# Patient Record
Sex: Female | Born: 1945 | Race: Asian | Hispanic: No | Marital: Married | Smoking: Never smoker
Health system: Southern US, Community
[De-identification: ages and names within clinical notes are randomized; demographics above are authoritative.]

## PROBLEM LIST (undated history)

## (undated) DIAGNOSIS — I10 Essential (primary) hypertension: Secondary | ICD-10-CM

## (undated) DIAGNOSIS — D126 Benign neoplasm of colon, unspecified: Secondary | ICD-10-CM

## (undated) DIAGNOSIS — K5790 Diverticulosis of intestine, part unspecified, without perforation or abscess without bleeding: Secondary | ICD-10-CM

## (undated) DIAGNOSIS — F32A Depression, unspecified: Secondary | ICD-10-CM

## (undated) DIAGNOSIS — R011 Cardiac murmur, unspecified: Secondary | ICD-10-CM

## (undated) DIAGNOSIS — C50919 Malignant neoplasm of unspecified site of unspecified female breast: Secondary | ICD-10-CM

## (undated) DIAGNOSIS — F329 Major depressive disorder, single episode, unspecified: Secondary | ICD-10-CM

## (undated) DIAGNOSIS — I499 Cardiac arrhythmia, unspecified: Secondary | ICD-10-CM

## (undated) DIAGNOSIS — Z9289 Personal history of other medical treatment: Secondary | ICD-10-CM

## (undated) HISTORY — DX: Diverticulosis of intestine, part unspecified, without perforation or abscess without bleeding: K57.90

## (undated) HISTORY — PX: TUBAL LIGATION: SHX77

## (undated) HISTORY — PX: CATARACT EXTRACTION, BILATERAL: SHX1313

## (undated) HISTORY — DX: Benign neoplasm of colon, unspecified: D12.6

## (undated) HISTORY — PX: BREAST SURGERY: SHX581

## (undated) HISTORY — DX: Malignant neoplasm of unspecified site of unspecified female breast: C50.919

---

## 2015-07-05 ENCOUNTER — Emergency Department (HOSPITAL_COMMUNITY): Payer: Medicare Other

## 2015-07-05 ENCOUNTER — Encounter (HOSPITAL_COMMUNITY): Payer: Self-pay | Admitting: Emergency Medicine

## 2015-07-05 ENCOUNTER — Inpatient Hospital Stay (HOSPITAL_COMMUNITY)
Admission: EM | Admit: 2015-07-05 | Discharge: 2015-07-08 | DRG: 481 | Disposition: A | Discharge status: Skilled Nursing Facility | Payer: Medicare Other | Attending: Internal Medicine | Admitting: Internal Medicine

## 2015-07-05 DIAGNOSIS — Z79899 Other long term (current) drug therapy: Secondary | ICD-10-CM

## 2015-07-05 DIAGNOSIS — I10 Essential (primary) hypertension: Secondary | ICD-10-CM | POA: Diagnosis present

## 2015-07-05 DIAGNOSIS — C50919 Malignant neoplasm of unspecified site of unspecified female breast: Secondary | ICD-10-CM | POA: Diagnosis present

## 2015-07-05 DIAGNOSIS — N390 Urinary tract infection, site not specified: Secondary | ICD-10-CM | POA: Diagnosis present

## 2015-07-05 DIAGNOSIS — D62 Acute posthemorrhagic anemia: Secondary | ICD-10-CM | POA: Diagnosis not present

## 2015-07-05 DIAGNOSIS — M25552 Pain in left hip: Secondary | ICD-10-CM | POA: Diagnosis present

## 2015-07-05 DIAGNOSIS — I472 Ventricular tachycardia: Secondary | ICD-10-CM | POA: Diagnosis present

## 2015-07-05 DIAGNOSIS — I4729 Other ventricular tachycardia: Secondary | ICD-10-CM

## 2015-07-05 DIAGNOSIS — Z853 Personal history of malignant neoplasm of breast: Secondary | ICD-10-CM

## 2015-07-05 DIAGNOSIS — Y92512 Supermarket, store or market as the place of occurrence of the external cause: Secondary | ICD-10-CM

## 2015-07-05 DIAGNOSIS — R079 Chest pain, unspecified: Secondary | ICD-10-CM | POA: Diagnosis not present

## 2015-07-05 DIAGNOSIS — R008 Other abnormalities of heart beat: Secondary | ICD-10-CM | POA: Diagnosis present

## 2015-07-05 DIAGNOSIS — C7951 Secondary malignant neoplasm of bone: Secondary | ICD-10-CM | POA: Diagnosis present

## 2015-07-05 DIAGNOSIS — W010XXA Fall on same level from slipping, tripping and stumbling without subsequent striking against object, initial encounter: Secondary | ICD-10-CM | POA: Diagnosis present

## 2015-07-05 DIAGNOSIS — I493 Ventricular premature depolarization: Secondary | ICD-10-CM | POA: Diagnosis present

## 2015-07-05 DIAGNOSIS — I499 Cardiac arrhythmia, unspecified: Secondary | ICD-10-CM

## 2015-07-05 DIAGNOSIS — Z09 Encounter for follow-up examination after completed treatment for conditions other than malignant neoplasm: Secondary | ICD-10-CM

## 2015-07-05 DIAGNOSIS — I498 Other specified cardiac arrhythmias: Secondary | ICD-10-CM | POA: Diagnosis present

## 2015-07-05 DIAGNOSIS — S7222XA Displaced subtrochanteric fracture of left femur, initial encounter for closed fracture: Principal | ICD-10-CM | POA: Diagnosis present

## 2015-07-05 DIAGNOSIS — C4020 Malignant neoplasm of long bones of unspecified lower limb: Secondary | ICD-10-CM

## 2015-07-05 DIAGNOSIS — S7292XA Unspecified fracture of left femur, initial encounter for closed fracture: Secondary | ICD-10-CM | POA: Diagnosis present

## 2015-07-05 HISTORY — DX: Essential (primary) hypertension: I10

## 2015-07-05 LAB — BASIC METABOLIC PANEL
ANION GAP: 7 (ref 5–15)
BUN: 13 mg/dL (ref 6–20)
CALCIUM: 8.9 mg/dL (ref 8.9–10.3)
CO2: 27 mmol/L (ref 22–32)
CREATININE: 0.6 mg/dL (ref 0.44–1.00)
Chloride: 108 mmol/L (ref 101–111)
Glucose, Bld: 132 mg/dL — ABNORMAL HIGH (ref 65–99)
Potassium: 3.5 mmol/L (ref 3.5–5.1)
SODIUM: 142 mmol/L (ref 135–145)

## 2015-07-05 LAB — CBC WITH DIFFERENTIAL/PLATELET
BASOS ABS: 0 10*3/uL (ref 0.0–0.1)
BASOS PCT: 0 %
EOS ABS: 0 10*3/uL (ref 0.0–0.7)
Eosinophils Relative: 0 %
HCT: 37.4 % (ref 36.0–46.0)
HEMOGLOBIN: 12.7 g/dL (ref 12.0–15.0)
Lymphocytes Relative: 16 %
Lymphs Abs: 1.5 10*3/uL (ref 0.7–4.0)
MCH: 32.1 pg (ref 26.0–34.0)
MCHC: 34 g/dL (ref 30.0–36.0)
MCV: 94.4 fL (ref 78.0–100.0)
MONOS PCT: 4 %
Monocytes Absolute: 0.3 10*3/uL (ref 0.1–1.0)
NEUTROS PCT: 80 %
Neutro Abs: 7.3 10*3/uL (ref 1.7–7.7)
Platelets: 220 10*3/uL (ref 150–400)
RBC: 3.96 MIL/uL (ref 3.87–5.11)
RDW: 12.1 % (ref 11.5–15.5)
WBC: 9.2 10*3/uL (ref 4.0–10.5)

## 2015-07-05 LAB — MAGNESIUM: Magnesium: 1.8 mg/dL (ref 1.7–2.4)

## 2015-07-05 LAB — TROPONIN I: Troponin I: <0.03 ng/mL (ref <0.031)

## 2015-07-05 LAB — TYPE AND SCREEN
ABO/RH(D): AB POS
Antibody Screen: NEGATIVE

## 2015-07-05 LAB — PROTIME-INR
INR: 1.03 (ref 0.00–1.49)
PROTHROMBIN TIME: 13.7 s (ref 11.6–15.2)

## 2015-07-05 LAB — ABO/RH: ABO/RH(D): AB POS

## 2015-07-05 MED ORDER — KETOROLAC TROMETHAMINE 30 MG/ML IJ SOLN
30.0000 mg | Freq: Once | INTRAMUSCULAR | Status: AC
  Administered 2015-07-05: 30 mg via INTRAVENOUS
  Filled 2015-07-05: qty 1

## 2015-07-05 MED ORDER — METOPROLOL TARTRATE 25 MG PO TABS
25.0000 mg | ORAL_TABLET | Freq: Two times a day (BID) | ORAL | Status: DC
  Administered 2015-07-05 – 2015-07-08 (×6): 25 mg via ORAL
  Filled 2015-07-05 (×6): qty 1

## 2015-07-05 MED ORDER — POTASSIUM CHLORIDE 10 MEQ/100ML IV SOLN
10.0000 meq | INTRAVENOUS | Status: AC
  Administered 2015-07-05 (×2): 10 meq via INTRAVENOUS
  Filled 2015-07-05: qty 100

## 2015-07-05 MED ORDER — ONDANSETRON HCL 4 MG/2ML IJ SOLN: 4.0000 mg | Freq: Four times a day (QID) | INTRAMUSCULAR | Status: DC | PRN

## 2015-07-05 MED ORDER — METOPROLOL TARTRATE 25 MG PO TABS: 25.0000 mg | ORAL_TABLET | Freq: Once | ORAL | Status: AC

## 2015-07-05 MED ORDER — SODIUM CHLORIDE 0.9 % IJ SOLN
3.0000 mL | Freq: Two times a day (BID) | INTRAMUSCULAR | Status: DC
  Administered 2015-07-05: 3 mL via INTRAVENOUS

## 2015-07-05 MED ORDER — LISINOPRIL 20 MG PO TABS
20.0000 mg | ORAL_TABLET | Freq: Every day | ORAL | Status: DC
  Administered 2015-07-06 – 2015-07-08 (×3): 20 mg via ORAL
  Filled 2015-07-05 (×3): qty 1

## 2015-07-05 MED ORDER — MORPHINE SULFATE (PF) 4 MG/ML IV SOLN
4.0000 mg | Freq: Once | INTRAVENOUS | Status: AC
  Administered 2015-07-05: 4 mg via INTRAVENOUS
  Filled 2015-07-05: qty 1

## 2015-07-05 MED ORDER — MAGNESIUM SULFATE 2 GM/50ML IV SOLN
2.0000 g | Freq: Once | INTRAVENOUS | Status: AC
  Administered 2015-07-05: 2 g via INTRAVENOUS
  Filled 2015-07-05: qty 50

## 2015-07-05 MED ORDER — POTASSIUM CHLORIDE IN NACL 20-0.9 MEQ/L-% IV SOLN
INTRAVENOUS | Status: AC
Start: 2015-07-05 — End: 2015-07-07
  Administered 2015-07-05 – 2015-07-06 (×2): via INTRAVENOUS
  Filled 2015-07-05 (×5): qty 1000

## 2015-07-05 MED ORDER — BISMUTH SUBSALICYLATE 262 MG/15ML PO SUSP: 30.0000 mL | Freq: Four times a day (QID) | ORAL | Status: DC | PRN

## 2015-07-05 MED ORDER — PANTOPRAZOLE SODIUM 40 MG PO TBEC
40.0000 mg | DELAYED_RELEASE_TABLET | Freq: Every day | ORAL | Status: DC
  Administered 2015-07-05 – 2015-07-08 (×4): 40 mg via ORAL
  Filled 2015-07-05 (×4): qty 1

## 2015-07-05 MED ORDER — CALCIUM CARBONATE-VITAMIN D 500-200 MG-UNIT PO TABS
ORAL_TABLET | Freq: Two times a day (BID) | ORAL | Status: DC
  Administered 2015-07-05 – 2015-07-08 (×6): 1 via ORAL
  Filled 2015-07-05 (×8): qty 1

## 2015-07-05 MED ORDER — POTASSIUM CHLORIDE 10 MEQ/100ML IV SOLN
10.0000 meq | INTRAVENOUS | Status: AC
  Filled 2015-07-05: qty 100

## 2015-07-05 MED ORDER — MORPHINE SULFATE (PF) 4 MG/ML IV SOLN
4.0000 mg | INTRAVENOUS | Status: DC | PRN
  Administered 2015-07-06: 4 mg via INTRAVENOUS
  Filled 2015-07-05: qty 1

## 2015-07-05 MED ORDER — RISAQUAD PO CAPS
1.0000 | ORAL_CAPSULE | Freq: Every day | ORAL | Status: DC
  Administered 2015-07-06 – 2015-07-08 (×3): 1 via ORAL
  Filled 2015-07-05 (×4): qty 1

## 2015-07-05 MED ORDER — METOPROLOL TARTRATE 1 MG/ML IV SOLN
5.0000 mg | Freq: Once | INTRAVENOUS | Status: AC
  Administered 2015-07-05: 5 mg via INTRAVENOUS
  Filled 2015-07-05: qty 5

## 2015-07-05 MED ORDER — EXEMESTANE 25 MG PO TABS
25.0000 mg | ORAL_TABLET | Freq: Every day | ORAL | Status: DC
  Administered 2015-07-06 – 2015-07-08 (×3): 25 mg via ORAL
  Filled 2015-07-05 (×3): qty 1

## 2015-07-05 NOTE — ED Notes (Signed)
2 IV start attempts 

## 2015-07-05 NOTE — H&P (Signed)
[facility] History and Physical  [name] [id number] DOB: 30-Jun-1946 DOA: 07/05/2015  Referring physician: Noemi Chapel, M.D. PCP: No primary care provider on file.   Chief Complaint: Fall  HPI: [name] is a 69 y.o. female with a past medical history of breast cancer, hypertension, unspecified arrhythmia (which the patient stated was due to anxiety) who was brought to the emergency department via EMS after she fell at a grocery store while she was walking on a slippery floor. Per patient, she fell on her left side, had immediate intense pain, noticed a deformity in her thigh and was unable to get up on her own. She states that she did not lose consciousness or have head trauma during the fall. She denies having pre-fall chest pain, palpitations, dizziness, dyspnea, diaphoresis, nausea or emesis. She states that this was purely an accident.  During transportation to the emergency department, the patient was given intranasal fentanyl which helped relieve her pain. EMS described 7 beat nonsustained ventricular tachycardia run, with other smaller runs noticed on telemetry in the emergency department alternating with ventricular bigeminy. The patient is taking metoprolol 25 mg by mouth twice a day, but had not taken the last 2 scheduled doses prior to her accident. She has a previous history of tachyarrhythmia, but workup done several years ago apparently did not show any CAD.   When seen in the emergency department, the patient was in no acute distress. She is states that she was in no pain, as long as she did not move her left lower extremity.   Review of Systems:  Constitutional:  No weight loss, night sweats, Fevers, chills, fatigue.  HEENT:  No headaches, Difficulty swallowing,Tooth/dental problems,Sore throat,  No sneezing, itching, ear ache, nasal congestion, post nasal drip,  Cardio-vascular:  No chest pain, Orthopnea, PND, swelling in lower extremities, anasarca,  dizziness, palpitations  GI:  No heartburn, indigestion, abdominal pain, nausea, vomiting, diarrhea, change in bowel habits, loss of appetite  Resp:  Denies dyspnea, productive cough, hemoptysis or wheezing. Skin:  no rash or lesions.  GU:  no dysuria, change in color of urine, no urgency or frequency. No flank pain.  Musculoskeletal:  Left femur/thigh pain and decreased range of motion. Chronic back pain.  Psych:  No change in mood or affect. No depression or anxiety. No memory loss.   Past Medical History  Diagnosis Date  . Hypertension   . Cancer [facility])    Past Surgical History  Procedure Laterality Date  . Breast surgery    . Tubal ligation     Social History:  reports that she has never smoked. She does not have any smokeless tobacco history on file. She reports that she drinks alcohol. She reports that she does not use illicit drugs.  Allergies  Allergen Reactions  . Keflex [Cephalexin] Diarrhea    No family history on file.   Prior to Admission medications   Medication Sig Start Date End Date Taking? Authorizing Provider  bismuth subsalicylate (PEPTO BISMOL) 262 MG/15ML suspension Take 30 mLs by mouth every 6 (six) hours as needed for indigestion.   Yes Historical Provider, MD  Calcium Carb-Cholecalciferol (CALCIUM 600 + D PO) Take 1 tablet by mouth 2 (two) times daily.   Yes Historical Provider, MD  exemestane (AROMASIN) 25 MG tablet Take 25 mg by mouth daily. 07/04/15  Yes Historical Provider, MD  lisinopril (PRINIVIL,ZESTRIL) 20 MG tablet Take 20 mg by mouth daily. 06/01/15  Yes Historical Provider, MD  metoprolol tartrate (  LOPRESSOR) 25 MG tablet Take 25 mg by mouth 2 (two) times daily.   Yes Historical Provider, MD  naproxen sodium (ANAPROX) 220 MG tablet Take 440 mg by mouth daily as needed (pain).   Yes Historical Provider, MD  Probiotic Product (PROBIOTIC PO) Take 1 capsule by mouth daily.   Yes Historical Provider, MD   Physical Exam: Filed Vitals:    07/05/15 1629 07/05/15 1855  BP: 150/67 146/78  Pulse: 54 58  Temp: 97.9 F (36.6 C) 97.9 F (36.6 C)  TempSrc: Oral Oral  Resp: 16 16  SpO2: 97% 98%    Wt Readings from Last 3 Encounters:  No data found for Wt    General:  Appears calm and comfortable Eyes: PERRL, normal lids, irises & conjunctiva ENT: grossly normal hearing, lips & oral mucosa are moist. Neck: no LAD, masses or thyromegaly Cardiovascular: Irregular rhythm, tachycardic. No LE edema. Telemetry: Sinus tachycardia with ventricular bigeminy and occasional nonsustained ventricular tachycardia (longest 7 beats). Respiratory: CTA bilaterally, no w/r/r. Normal respiratory effort. Abdomen: soft, ntnd Skin: no rash or induration seen on limited exam Musculoskeletal: Shortened left lower extremity, palpable distal pulses. No delayed capillary refill. Psychiatric: grossly normal mood and affect, speech fluent and appropriate Neurologic: grossly non-focal.          Labs on Admission:  Basic Metabolic Panel:  Recent Labs Lab 07/05/15 1732 07/05/15 1832  NA 142  --   K 3.5  --   CL 108  --   CO2 27  --   GLUCOSE 132*  --   BUN 13  --   CREATININE 0.60  --   CALCIUM 8.9  --   MG  --  1.8   CBC:  Recent Labs Lab 07/05/15 1732  WBC 9.2  NEUTROABS 7.3  HGB 12.7  HCT 37.4  MCV 94.4  PLT 220   Cardiac Enzymes:  Recent Labs Lab 07/05/15 1732  TROPONINI <0.03    Radiological Exams on Admission: Dg Chest 1 View  07/05/2015  CLINICAL DATA:  Preop for hip fracture. EXAM: CHEST 1 VIEW COMPARISON:  None. FINDINGS: Cardiomegaly with negative aortic and hilar contours. There is no edema, consolidation, effusion, or pneumothorax. Left axillary dissection. No acute osseous findings at the chest. IMPRESSION: 1. No active disease. 2. Mild cardiomegaly. Electronically Signed   By: Monte Fantasia M.D.   On: 07/05/2015 17:20   Dg Hip Unilat With Pelvis 2-3 Views Left  07/05/2015  CLINICAL DATA:  Status post  fall. EXAM: DG HIP (WITH OR WITHOUT PELVIS) 2-3V LEFT COMPARISON:  None. FINDINGS: There is no left hip fracture or dislocation. There is a displaced and angulated fracture of the left proximal femoral diaphysis with 15 mm of lateral and 3 cm of posterior displacement and apex lateral angulation. There are sclerotic lesions scattered throughout the pelvis and proximal bilateral femurs most concerning for metastatic disease. IMPRESSION: 1. Displaced and angulated fracture of the left proximal femoral diaphysis . 2. 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 Electronically Signed   By: Kathreen Devoid   On: 07/05/2015 17:24   Dg Femur Min 2 Views Left  07/05/2015  CLINICAL DATA:  69 year old female with history of trauma from a fall complaining of left upper leg pain. EXAM: LEFT FEMUR 2 VIEWS COMPARISON:  No priors. FINDINGS: Two nonstandard views of the left femur are submitted for evaluation. There is a displaced foreshortened fracture in the proximal third of the left femoral diaphysis, with  approximately 1 shaft width of posterior displacement and at least 3.9 cm of proximal migration of the distal femoral fragment, and approximately 30 degrees of apex anterolateral angulation. Femoral head appears located. IMPRESSION: 1. Acute displaced foreshortened and angulated fracture of the proximal third of the left femoral diaphysis, as above. Electronically Signed   By: Vinnie Langton M.D.   On: 07/05/2015 17:22    EKG: Independently reviewed. Vent. rate 117 BPM PR interval 144 ms QRS duration 128 ms QT/QTc 391/546 ms P-R-T axes 65 70 64  Sinus tachycardia Ventricular bigeminy Probable left atrial enlargement Right bundle branch block Borderline ST elevation Baseline wander in lead(s) V6  Assessment/Plan Principal Problem:   Femur fracture, left, closed, initial encounter Admit to telemetry. Continue analgesics as needed. Orthopedic  surgery to evaluate. Check preop Echocardiogram.  Active Problems:    Ventricular bigeminy   Non-sustained ventricular tachycardia (HCC) The patient is asymptomatic at this time. She missed the last 2 doses of metoprolol. Single dose of IV metoprolol 5 mg 1 Resume oral metoprolol this evening. Optimize potassium and magnesium levels.    Breast cancer ([place]) Continue on Aromasin 25 mg by mouth daily.    Hypertension Continue metoprolol 25 mg by mouth twice a day. Monitor blood pressure closely while the patient is in the hospital.   Orthopedic surgery is following   Code Status: Full code. DVT Prophylaxis: SCDs on right lower extremity prior to surgery. Family Communication:  Disposition Plan: Admit to telemetry, check preop echocardiogram. Orthopedic surgery to evaluate.  Time spent: Over 70 minutes were spent during the process of this admission.  Reubin Milan [facility] Pager [phone].

## 2015-07-05 NOTE — ED Notes (Addendum)
Patient presents via EMS for fall. Per EMS slipped and fell, deformity to left hip, c-collar in place, denies LOC. History of breast CA. No anticoagulants. A&O x4.   226mcg fent intranasally   Last Vs 152/84, 100hr, 16resp, 97%ra

## 2015-07-05 NOTE — ED Notes (Signed)
Patient transported to X-ray 

## 2015-07-05 NOTE — ED Provider Notes (Signed)
CSN: [id number]     Arrival date & time 07/05/15  1611 History   First MD Initiated Contact with Patient 07/05/15 1613     Chief Complaint  Patient presents with  . Fall  . Hip Injury     (Consider location/radiation/quality/duration/timing/severity/associated sxs/prior Treatment) HPI Comments: The patient is a 69 year old female, she was in the grocery store walking when she slipped on a slick floor, fell onto her left hip and had acute onset of pain and deformity. Paramedics were called and found the patient on the floor unable to ambulate with a deformity of the proximal thigh. Her leg was shortened and externally rotated. She denies head injury or loss of consciousness, there is no neck pain numbness or weakness however they immobilized her neck for travel. 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 She is not on any blood thinners, she was given 200 g of fentanyl intranasally prior to arrival. Her pain is persistent, moderate, worse with movement.  Patient is a 69 y.o. female presenting with fall. The history is provided by the patient.  Fall    No past medical history on file. No past surgical history on file. No family history on file. Social History  Substance Use Topics  . Smoking status: Not on file  . Smokeless tobacco: Not on file  . Alcohol Use: Not on file   OB History    No data available     Review of Systems  All other systems reviewed and are negative.     Allergies  Review of patient's allergies indicates not on file.  Home Medications   Prior to Admission medications   Not on File   There were no vitals taken for this visit. Physical Exam  Constitutional: She appears well-developed and well-nourished. No distress.  HENT:  Head: Normocephalic and atraumatic.  Mouth/Throat: Oropharynx is clear and moist. No oropharyngeal exudate.  Eyes: Conjunctivae and  EOM are normal. Pupils are equal, round, and reactive to light. Right eye exhibits no discharge. Left eye exhibits no discharge. No scleral icterus.  Neck: Normal range of motion. Neck supple. No JVD present. No thyromegaly present.  Cardiovascular: Normal rate, regular rhythm, normal heart sounds and intact distal pulses.  Exam reveals no gallop and no friction rub.   No murmur heard. Normal pulses at the foot on the left  Pulmonary/Chest: Effort normal and breath sounds normal. No respiratory distress. She has no wheezes. She has no rales.  Abdominal: Soft. Bowel sounds are normal. She exhibits no distension and no mass. There is no tenderness.  Musculoskeletal: Normal range of motion. She exhibits tenderness ( Left lower extremity is shortened and externally rotated, there is obvious deformity of the proximal thigh with swelling). She exhibits no edema.  Lymphadenopathy:    She has no cervical adenopathy.  Neurological: She is alert. Coordination normal.  Normal sensation and motor to the distal left lower extremity from the knee down  Skin: Skin is warm and dry. No rash noted. No erythema.  Psychiatric: She has a normal mood and affect. Her behavior is normal.  Nursing note and vitals reviewed.   ED Course  Procedures (including critical care time) Labs Review Labs Reviewed  BASIC METABOLIC PANEL  CBC WITH DIFFERENTIAL/PLATELET  PROTIME-INR  URINALYSIS, ROUTINE W REFLEX MICROSCOPIC (NOT AT [facility])  TROPONIN I  TYPE AND SCREEN    Imaging Review No results found.  I have personally reviewed and evaluated these images and lab results as part of my medical decision-making.   EKG Interpretation None      MDM   Final diagnoses:  None    The patient has had a fall, there is no head injury, she has an obvious deformity of the thigh consistent with a femoral fracture, she will need pain control, preop labs, orthopedic consultation if broken. The patient is in agreement with the  plan. The paramedics have also shown me strips from cardiac monitoring in route which shows several runs of PVCs 4 or 5 beats in a row. This appears to be nonsustained ventricular tachycardia. The patient denies any chest pain shortness of breath or weakness to me. She definitely states that the floor was slippery, the paramedics confirm this report.  D/w Dr. Alvan Dame - will do surgery D/w Dr. Olevia Bowens - will admit  Meds given in ED:  Medications  morphine 4 MG/ML injection 4 mg (not administered)  metoprolol (LOPRESSOR) injection 5 mg (not administered)  magnesium sulfate IVPB 2 g 50 mL (not administered)      Noemi Chapel, MD 07/05/15 1844

## 2015-07-05 NOTE — ED Notes (Signed)
IV team bedside. 

## 2015-07-05 NOTE — ED Notes (Signed)
Bed: WA04 Expected date:  Expected time:  Means of arrival:  Comments: EMS 

## 2015-07-05 NOTE — ED Notes (Signed)
Waiting for patient to call out when ready

## 2015-07-06 ENCOUNTER — Inpatient Hospital Stay (HOSPITAL_COMMUNITY): Payer: Medicare Other

## 2015-07-06 ENCOUNTER — Inpatient Hospital Stay (HOSPITAL_COMMUNITY): Payer: Medicare Other | Admitting: Certified Registered"

## 2015-07-06 ENCOUNTER — Encounter (HOSPITAL_COMMUNITY): Payer: Self-pay | Admitting: Certified Registered"

## 2015-07-06 ENCOUNTER — Encounter (HOSPITAL_COMMUNITY)
Admission: EM | Disposition: A | Discharge status: Skilled Nursing Facility | Payer: Self-pay | Source: Home / Self Care | Attending: Internal Medicine

## 2015-07-06 DIAGNOSIS — I10 Essential (primary) hypertension: Secondary | ICD-10-CM

## 2015-07-06 DIAGNOSIS — S7292XA Unspecified fracture of left femur, initial encounter for closed fracture: Secondary | ICD-10-CM

## 2015-07-06 DIAGNOSIS — I472 Ventricular tachycardia: Secondary | ICD-10-CM

## 2015-07-06 DIAGNOSIS — R079 Chest pain, unspecified: Secondary | ICD-10-CM

## 2015-07-06 DIAGNOSIS — I499 Cardiac arrhythmia, unspecified: Secondary | ICD-10-CM

## 2015-07-06 DIAGNOSIS — C50919 Malignant neoplasm of unspecified site of unspecified female breast: Secondary | ICD-10-CM

## 2015-07-06 DIAGNOSIS — S7222XA Displaced subtrochanteric fracture of left femur, initial encounter for closed fracture: Secondary | ICD-10-CM | POA: Diagnosis present

## 2015-07-06 HISTORY — PX: FEMUR IM NAIL: SHX1597

## 2015-07-06 LAB — COMPREHENSIVE METABOLIC PANEL
ALBUMIN: 3.7 g/dL (ref 3.5–5.0)
ALT: 9 U/L — ABNORMAL LOW (ref 14–54)
ANION GAP: 8 (ref 5–15)
AST: 20 U/L (ref 15–41)
Alkaline Phosphatase: 45 U/L (ref 38–126)
BILIRUBIN TOTAL: 0.8 mg/dL (ref 0.3–1.2)
BUN: 14 mg/dL (ref 6–20)
CHLORIDE: 108 mmol/L (ref 101–111)
CO2: 22 mmol/L (ref 22–32)
Calcium: 8.3 mg/dL — ABNORMAL LOW (ref 8.9–10.3)
Creatinine, Ser: 0.64 mg/dL (ref 0.44–1.00)
GFR calc Af Amer: >60 mL/min (ref >60)
GFR calc non Af Amer: >60 mL/min (ref >60)
GLUCOSE: 103 mg/dL — AB (ref 65–99)
POTASSIUM: 3.8 mmol/L (ref 3.5–5.1)
SODIUM: 138 mmol/L (ref 135–145)
TOTAL PROTEIN: 6.5 g/dL (ref 6.5–8.1)

## 2015-07-06 LAB — URINE MICROSCOPIC-ADD ON

## 2015-07-06 LAB — URINALYSIS, ROUTINE W REFLEX MICROSCOPIC
Bilirubin Urine: NEGATIVE
GLUCOSE, UA: NEGATIVE mg/dL
Ketones, ur: 15 mg/dL — AB
Nitrite: POSITIVE — AB
PH: 5.5 (ref 5.0–8.0)
Protein, ur: NEGATIVE mg/dL
SPECIFIC GRAVITY, URINE: 1.012 (ref 1.005–1.030)

## 2015-07-06 LAB — CBC
HEMATOCRIT: 37.3 % (ref 36.0–46.0)
HEMOGLOBIN: 12.6 g/dL (ref 12.0–15.0)
MCH: 32.1 pg (ref 26.0–34.0)
MCHC: 33.8 g/dL (ref 30.0–36.0)
MCV: 95.2 fL (ref 78.0–100.0)
Platelets: 248 10*3/uL (ref 150–400)
RBC: 3.92 MIL/uL (ref 3.87–5.11)
RDW: 12.2 % (ref 11.5–15.5)
WBC: 8.9 10*3/uL (ref 4.0–10.5)

## 2015-07-06 LAB — SURGICAL PCR SCREEN
MRSA, PCR: NEGATIVE
STAPHYLOCOCCUS AUREUS: POSITIVE — AB

## 2015-07-06 LAB — TROPONIN I: TROPONIN I: 0.03 ng/mL (ref <0.031)

## 2015-07-06 SURGERY — INSERTION, INTRAMEDULLARY ROD, FEMUR
Anesthesia: General | Site: Hip | Laterality: Left

## 2015-07-06 MED ORDER — ONDANSETRON HCL 4 MG/2ML IJ SOLN: 4.0000 mg | Freq: Four times a day (QID) | INTRAMUSCULAR | Status: DC | PRN

## 2015-07-06 MED ORDER — OXYCODONE HCL 5 MG PO TABS
5.0000 mg | ORAL_TABLET | ORAL | Status: DC | PRN
  Administered 2015-07-07 – 2015-07-08 (×6): 10 mg via ORAL
  Filled 2015-07-06 (×6): qty 2

## 2015-07-06 MED ORDER — HYDROMORPHONE HCL 2 MG/ML IJ SOLN
INTRAMUSCULAR | Status: AC
  Filled 2015-07-06: qty 1

## 2015-07-06 MED ORDER — ISOPROPYL ALCOHOL 70 % SOLN
Status: AC
  Filled 2015-07-06: qty 480

## 2015-07-06 MED ORDER — SODIUM CHLORIDE 0.9 % IV SOLN
10.0000 mg | INTRAVENOUS | Status: DC | PRN
  Administered 2015-07-06: 50 ug/min via INTRAVENOUS

## 2015-07-06 MED ORDER — FENTANYL CITRATE (PF) 100 MCG/2ML IJ SOLN: 25.0000 ug | INTRAMUSCULAR | Status: DC | PRN

## 2015-07-06 MED ORDER — DEXAMETHASONE SODIUM PHOSPHATE 10 MG/ML IJ SOLN
INTRAMUSCULAR | Status: DC | PRN
  Administered 2015-07-06: 10 mg via INTRAVENOUS

## 2015-07-06 MED ORDER — LACTATED RINGERS IV SOLN
INTRAVENOUS | Status: DC | PRN
  Administered 2015-07-06: 18:00:00 via INTRAVENOUS

## 2015-07-06 MED ORDER — LIDOCAINE HCL (PF) 2 % IJ SOLN
INTRAMUSCULAR | Status: DC | PRN
  Administered 2015-07-06: 100 mg via INTRADERMAL

## 2015-07-06 MED ORDER — PHENOL 1.4 % MT LIQD: 1.0000 | OROMUCOSAL | Status: DC | PRN

## 2015-07-06 MED ORDER — ONDANSETRON HCL 4 MG PO TABS: 4.0000 mg | ORAL_TABLET | Freq: Four times a day (QID) | ORAL | Status: DC | PRN

## 2015-07-06 MED ORDER — SENNA 8.6 MG PO TABS
1.0000 | ORAL_TABLET | Freq: Two times a day (BID) | ORAL | Status: DC
  Administered 2015-07-06 – 2015-07-08 (×4): 8.6 mg via ORAL
  Filled 2015-07-06 (×4): qty 1

## 2015-07-06 MED ORDER — SUGAMMADEX SODIUM 200 MG/2ML IV SOLN
INTRAVENOUS | Status: DC | PRN
  Administered 2015-07-06: 200 mg via INTRAVENOUS

## 2015-07-06 MED ORDER — FENTANYL CITRATE (PF) 100 MCG/2ML IJ SOLN
INTRAMUSCULAR | Status: DC | PRN
  Administered 2015-07-06: 100 ug via INTRAVENOUS

## 2015-07-06 MED ORDER — CEFAZOLIN SODIUM-DEXTROSE 2-3 GM-% IV SOLR
2.0000 g | INTRAVENOUS | Status: AC
  Administered 2015-07-06: 2 g via INTRAVENOUS
  Filled 2015-07-06: qty 50

## 2015-07-06 MED ORDER — PROPOFOL 10 MG/ML IV BOLUS
INTRAVENOUS | Status: AC
  Filled 2015-07-06: qty 20

## 2015-07-06 MED ORDER — IOHEXOL 300 MG/ML  SOLN
100.0000 mL | Freq: Once | INTRAMUSCULAR | Status: AC | PRN
  Administered 2015-07-06: 100 mL via INTRAVENOUS

## 2015-07-06 MED ORDER — SUCCINYLCHOLINE CHLORIDE 20 MG/ML IJ SOLN
INTRAMUSCULAR | Status: DC | PRN
  Administered 2015-07-06: 100 mg via INTRAVENOUS

## 2015-07-06 MED ORDER — ONDANSETRON HCL 4 MG/2ML IJ SOLN: 4.0000 mg | Freq: Once | INTRAMUSCULAR | Status: DC | PRN

## 2015-07-06 MED ORDER — ONDANSETRON HCL 4 MG/2ML IJ SOLN
INTRAMUSCULAR | Status: AC
  Filled 2015-07-06: qty 2

## 2015-07-06 MED ORDER — MIDAZOLAM HCL 2 MG/2ML IJ SOLN
INTRAMUSCULAR | Status: AC
  Filled 2015-07-06: qty 2

## 2015-07-06 MED ORDER — ACETAMINOPHEN 325 MG PO TABS: 650.0000 mg | ORAL_TABLET | Freq: Four times a day (QID) | ORAL | Status: DC | PRN

## 2015-07-06 MED ORDER — DOCUSATE SODIUM 100 MG PO CAPS
100.0000 mg | ORAL_CAPSULE | Freq: Two times a day (BID) | ORAL | Status: DC
  Administered 2015-07-06 – 2015-07-08 (×4): 100 mg via ORAL
  Filled 2015-07-06 (×4): qty 1

## 2015-07-06 MED ORDER — MIDAZOLAM HCL 5 MG/5ML IJ SOLN
INTRAMUSCULAR | Status: DC | PRN
  Administered 2015-07-06: 2 mg via INTRAVENOUS

## 2015-07-06 MED ORDER — PHENYLEPHRINE 40 MCG/ML (10ML) SYRINGE FOR IV PUSH (FOR BLOOD PRESSURE SUPPORT)
PREFILLED_SYRINGE | INTRAVENOUS | Status: AC
  Filled 2015-07-06: qty 10

## 2015-07-06 MED ORDER — PROPOFOL 10 MG/ML IV BOLUS
INTRAVENOUS | Status: DC | PRN
  Administered 2015-07-06: 120 mg via INTRAVENOUS

## 2015-07-06 MED ORDER — SUGAMMADEX SODIUM 200 MG/2ML IV SOLN
INTRAVENOUS | Status: AC
  Filled 2015-07-06: qty 2

## 2015-07-06 MED ORDER — CIPROFLOXACIN IN D5W 400 MG/200ML IV SOLN
INTRAVENOUS | Status: AC
Start: 2015-07-06 — End: 2015-07-06
  Filled 2015-07-06: qty 200

## 2015-07-06 MED ORDER — ENOXAPARIN SODIUM 40 MG/0.4ML ~~LOC~~ SOLN
40.0000 mg | SUBCUTANEOUS | Status: DC
  Administered 2015-07-07 – 2015-07-08 (×2): 40 mg via SUBCUTANEOUS
  Filled 2015-07-06 (×2): qty 0.4

## 2015-07-06 MED ORDER — ACETAMINOPHEN 650 MG RE SUPP: 650.0000 mg | Freq: Four times a day (QID) | RECTAL | Status: DC | PRN

## 2015-07-06 MED ORDER — METOCLOPRAMIDE HCL 5 MG/ML IJ SOLN: 5.0000 mg | Freq: Three times a day (TID) | INTRAMUSCULAR | Status: DC | PRN

## 2015-07-06 MED ORDER — CIPROFLOXACIN IN D5W 400 MG/200ML IV SOLN
INTRAVENOUS | Status: DC | PRN
  Administered 2015-07-06: 400 mg via INTRAVENOUS

## 2015-07-06 MED ORDER — LIDOCAINE HCL (CARDIAC) 20 MG/ML IV SOLN
INTRAVENOUS | Status: AC
  Filled 2015-07-06: qty 5

## 2015-07-06 MED ORDER — CEFAZOLIN SODIUM-DEXTROSE 2-3 GM-% IV SOLR
2.0000 g | Freq: Four times a day (QID) | INTRAVENOUS | Status: AC
  Administered 2015-07-07 (×2): 2 g via INTRAVENOUS
  Filled 2015-07-06: qty 50

## 2015-07-06 MED ORDER — CEFAZOLIN SODIUM-DEXTROSE 2-3 GM-% IV SOLR
INTRAVENOUS | Status: AC
  Filled 2015-07-06: qty 50

## 2015-07-06 MED ORDER — PHENYLEPHRINE HCL 10 MG/ML IJ SOLN
INTRAMUSCULAR | Status: DC | PRN
  Administered 2015-07-06: 120 ug via INTRAVENOUS
  Administered 2015-07-06: 80 ug via INTRAVENOUS
  Administered 2015-07-06: 200 ug via INTRAVENOUS

## 2015-07-06 MED ORDER — FENTANYL CITRATE (PF) 100 MCG/2ML IJ SOLN
INTRAMUSCULAR | Status: AC
  Filled 2015-07-06: qty 2

## 2015-07-06 MED ORDER — HYDROMORPHONE HCL 1 MG/ML IJ SOLN
INTRAMUSCULAR | Status: DC | PRN
  Administered 2015-07-06: 1 mg via INTRAVENOUS

## 2015-07-06 MED ORDER — MORPHINE SULFATE (PF) 2 MG/ML IV SOLN
0.5000 mg | INTRAVENOUS | Status: DC | PRN
  Administered 2015-07-07: 0.5 mg via INTRAVENOUS
  Filled 2015-07-06: qty 1

## 2015-07-06 MED ORDER — LACTATED RINGERS IV SOLN
INTRAVENOUS | Status: DC | PRN
  Administered 2015-07-06 (×2): via INTRAVENOUS

## 2015-07-06 MED ORDER — METOCLOPRAMIDE HCL 10 MG PO TABS: 5.0000 mg | ORAL_TABLET | Freq: Three times a day (TID) | ORAL | Status: DC | PRN

## 2015-07-06 MED ORDER — DEXAMETHASONE SODIUM PHOSPHATE 10 MG/ML IJ SOLN
INTRAMUSCULAR | Status: AC
  Filled 2015-07-06: qty 1

## 2015-07-06 MED ORDER — ONDANSETRON HCL 4 MG/2ML IJ SOLN
INTRAMUSCULAR | Status: DC | PRN
  Administered 2015-07-06: 4 mg via INTRAVENOUS

## 2015-07-06 MED ORDER — MENTHOL 3 MG MT LOZG: 1.0000 | LOZENGE | OROMUCOSAL | Status: DC | PRN

## 2015-07-06 MED ORDER — ROCURONIUM BROMIDE 100 MG/10ML IV SOLN
INTRAVENOUS | Status: DC | PRN
  Administered 2015-07-06: 30 mg via INTRAVENOUS

## 2015-07-06 MED ORDER — ARTIFICIAL TEARS OP OINT
TOPICAL_OINTMENT | OPHTHALMIC | Status: AC
  Filled 2015-07-06: qty 3.5

## 2015-07-06 SURGICAL SUPPLY — 39 items
BAG ZIPLOCK 12X15 (MISCELLANEOUS) IMPLANT
BIT DRILL 4.2 (DRILL) ×1 IMPLANT
CHLORAPREP W/TINT 26ML (MISCELLANEOUS) ×3 IMPLANT
COVER PERINEAL POST (MISCELLANEOUS) ×3 IMPLANT
DERMABOND ADVANCED (GAUZE/BANDAGES/DRESSINGS) ×2
DERMABOND ADVANCED .7 DNX12 (GAUZE/BANDAGES/DRESSINGS) ×1 IMPLANT
DRAPE C-ARM 42X120 X-RAY (DRAPES) ×3 IMPLANT
DRAPE C-ARMOR (DRAPES) ×3 IMPLANT
DRAPE LG THREE QUARTER DISP (DRAPES) ×3 IMPLANT
DRAPE ORTHO SPLIT 77X108 STRL (DRAPES)
DRAPE SHEET LG 3/4 BI-LAMINATE (DRAPES) ×2 IMPLANT
DRAPE STERI IOBAN 125X83 (DRAPES) ×3 IMPLANT
DRAPE SURG ORHT 6 SPLT 77X108 (DRAPES) IMPLANT
DRAPE U-SHAPE 47X51 STRL (DRAPES) ×6 IMPLANT
DRILL 4.2 (DRILL) ×3
DRSG MEPILEX BORDER 4X4 (GAUZE/BANDAGES/DRESSINGS) ×3 IMPLANT
ELECT BLADE TIP CTD 4 INCH (ELECTRODE) ×3 IMPLANT
GAUZE SPONGE 4X4 12PLY STRL (GAUZE/BANDAGES/DRESSINGS) ×3 IMPLANT
GLOVE BIOGEL PI IND STRL 8.5 (GLOVE) ×1 IMPLANT
GLOVE BIOGEL PI INDICATOR 8.5 (GLOVE) ×2
GLOVE SURG ORTHO 8.5 STRL (GLOVE) ×9 IMPLANT
GOWN SPEC L3 XXLG W/TWL (GOWN DISPOSABLE) ×3 IMPLANT
GUIDEWIRE 3.2X400 (WIRE) ×6 IMPLANT
KIT BASIN OR (CUSTOM PROCEDURE TRAY) ×3 IMPLANT
LIQUID BAND (GAUZE/BANDAGES/DRESSINGS) IMPLANT
MANIFOLD NEPTUNE II (INSTRUMENTS) ×3 IMPLANT
NAIL DEG TI CANN TFNA 320 LT (Orthopedic Implant) ×3 IMPLANT
PACK TOTAL JOINT (CUSTOM PROCEDURE TRAY) ×3 IMPLANT
PEN SKIN MARKING BROAD (MISCELLANEOUS) ×3 IMPLANT
REAMER ROD DEEP FLUTE 2.5X950 (INSTRUMENTS) ×3 IMPLANT
SCREW CANN LOCK TI FT 5X30 (Screw) ×3 IMPLANT
SCREW TFNA 90MM HIP (Orthopedic Implant) ×3 IMPLANT
SUT MNCRL AB 3-0 PS2 18 (SUTURE) ×6 IMPLANT
SUT MON AB 2-0 CT1 27 (SUTURE) ×6 IMPLANT
SUT VIC AB 1 CT1 27 (SUTURE) ×4
SUT VIC AB 1 CT1 27XBRD ANTBC (SUTURE) ×2 IMPLANT
SUT VIC AB 2-0 CT1 27 (SUTURE) ×4
SUT VIC AB 2-0 CT1 27XBRD (SUTURE) ×2 IMPLANT
YANKAUER SUCT BULB TIP NO VENT (SUCTIONS) ×3 IMPLANT

## 2015-07-06 NOTE — Anesthesia Procedure Notes (Signed)
Procedure Name: Intubation Date/Time: 07/06/2015 6:47 PM Performed by: Lind Covert Pre-anesthesia Checklist: Patient identified, Timeout performed, Emergency Drugs available, Suction available and Patient being monitored Patient Re-evaluated:Patient Re-evaluated prior to inductionOxygen Delivery Method: Circle system utilized Preoxygenation: Pre-oxygenation with 100% oxygen Intubation Type: IV induction Ventilation: Mask ventilation without difficulty Laryngoscope Size: Mac and 3 Grade View: Grade III Tube type: Oral Tube size: 7.0 mm Number of attempts: 3 Airway Equipment and Method: Stylet and Bougie stylet (Easy to mask ventilate, base of cords visualized attempted x1 without stylet, x1 with stylet then bougie,, intubated on bed, better positioning ) Placement Confirmation: ETT inserted through vocal cords under direct vision,  breath sounds checked- equal and bilateral and positive ETCO2 Secured at: 21 cm Tube secured with: Tape Dental Injury: Teeth and Oropharynx as per pre-operative assessment  Difficulty Due To: Difficulty was unanticipated

## 2015-07-06 NOTE — Discharge Instructions (Signed)
°Dr. Lamaya Hyneman °Adult Hip & Knee Specialist °[facility] °[address] °[place],  [address] °[phone] ° ° °POSTOPERATIVE DIRECTIONS ° ° ° °Hip Rehabilitation, Guidelines Following Surgery  ° °WEIGHT BEARING °Partial weight bearing with assist device as directed.  50% with walker ° ° °HOME CARE INSTRUCTIONS  °Remove items at home which could result in a fall. This includes throw rugs or furniture in walking pathways.  °Continue medications as instructed at time of discharge. °· You may have some home medications which will be placed on hold until you complete the course of blood thinner medication. °· 4 days after discharge, you may start showering. No tub baths or soaking your incisions. °Do not put on socks or shoes without following the instructions of your caregivers.   °Sit on chairs with arms. Use the chair arms to help push yourself up when arising.  °Arrange for the use of a toilet seat elevator so you are not sitting low.  °· Walk with walker as instructed.  °You may resume a sexual relationship in one month or when given the OK by your caregiver.  °Use walker as long as suggested by your caregivers.  °Avoid periods of inactivity such as sitting longer than an hour when not asleep. This helps prevent blood clots.  °You may return to work once you are cleared by your surgeon.  °Do not drive a car for 6 weeks or until released by your surgeon.  °Do not drive while taking narcotics.  °Wear elastic stockings for two weeks following surgery during the day but you may remove then at night.  °Make sure you keep all of your appointments after your operation with all of your doctors and caregivers. You should call the office at the above phone number and make an appointment for approximately two weeks after the date of your surgery. °Please pick up a stool softener and laxative for home use as long as you are requiring pain medications. °· ICE to the affected hip every three  hours for 30 minutes at a time and then as needed for pain and swelling. Continue to use ice on the hip for pain and swelling from surgery. You may notice swelling that will progress down to the foot and ankle.  This is normal after surgery.  Elevate the leg when you are not up walking on it.   °It is important for you to complete the blood thinner medication as prescribed by your doctor. °· Continue to use the breathing machine which will help keep your temperature down.  It is common for your temperature to cycle up and down following surgery, especially at night when you are not up moving around and exerting yourself.  The breathing machine keeps your lungs expanded and your temperature down. ° °RANGE OF MOTION AND STRENGTHENING EXERCISES  °These exercises are designed to help you keep full movement of your hip joint. Follow your caregiver's or physical therapist's instructions. Perform all exercises about fifteen times, three times per day or as directed. Exercise both hips, even if you have had only one joint replacement. These exercises can be done on a training (exercise) mat, on the floor, on a table or on a bed. Use whatever works the best and is most comfortable for you. Use music or television while you are exercising so that the exercises are a pleasant break in your day. This will make your life better with the exercises acting as a break in routine you can look   forward to.  °Lying on your back, slowly slide your foot toward your buttocks, raising your knee up off the floor. Then slowly slide your foot back down until your leg is straight again.  °Lying on your back spread your legs as far apart as you can without causing discomfort.  °Lying on your side, raise your upper leg and foot straight up from the floor as far as is comfortable. Slowly lower the leg and repeat.  °Lying on your back, tighten up the muscle in the front of your thigh (quadriceps muscles). You can do this by keeping your leg  straight and trying to raise your heel off the floor. This helps strengthen the largest muscle supporting your knee.  °Lying on your back, tighten up the muscles of your buttocks both with the legs straight and with the knee bent at a comfortable angle while keeping your heel on the floor.  ° °SKILLED REHAB INSTRUCTIONS: °If the patient is transferred to a skilled rehab facility following release from the hospital, a list of the current medications will be sent to the facility for the patient to continue.  When discharged from the skilled rehab facility, please have the facility set up the patient's Home Health Physical Therapy prior to being released. Also, the skilled facility will be responsible for providing the patient with their medications at time of release from the facility to include their pain medication and their blood thinner medication. If the patient is still at the rehab facility at time of the two week follow up appointment, the skilled rehab facility will also need to assist the patient in arranging follow up appointment in our office and any transportation needs. ° °MAKE SURE YOU:  °Understand these instructions.  °Will watch your condition.  °Will get help right away if you are not doing well or get worse. ° °Pick up stool softner and laxative for home use following surgery while on pain medications. °Daily dry dressing changes as needed. °In 4 days, you may remove your dressings and begin taking showers - no tub baths or soaking the incisions. °Continue to use ice for pain and swelling after surgery. °Do not use any lotions or creams on the incision until instructed by your surgeon. ° ° °

## 2015-07-06 NOTE — Anesthesia Preprocedure Evaluation (Addendum)
Anesthesia Evaluation  Patient identified by MRN, date of birth, ID band Patient awake    Reviewed: Allergy & Precautions, NPO status , Patient's Chart, lab work & pertinent test results, reviewed documented beta blocker date and time   Airway Mallampati: II  TM Distance: >3 FB Neck ROM: Full    Dental  (+) Teeth Intact, Dental Advisory Given   Pulmonary neg pulmonary ROS,    Pulmonary exam normal breath sounds clear to auscultation       Cardiovascular Exercise Tolerance: Good hypertension, Pt. on medications and Pt. on home beta blockers (-) angina(-) CAD and (-) Past MI negative cardio ROS Normal cardiovascular exam Rhythm:Regular Rate:Normal     Neuro/Psych negative neurological ROS     GI/Hepatic negative GI ROS, Neg liver ROS,   Endo/Other  negative endocrine ROS  Renal/GU negative Renal ROS     Musculoskeletal negative musculoskeletal ROS (+)   Abdominal   Peds  Hematology negative hematology ROS (+)   Anesthesia Other Findings Day of surgery medications reviewed with the patient.  Breast Cancer s/p lumpectomy and lymph node dissection on left.  NO BP or IV Sticks Left arm  Reproductive/Obstetrics                           Anesthesia Physical Anesthesia Plan  ASA: II  Anesthesia Plan: General   Post-op Pain Management:    Induction: Intravenous  Airway Management Planned: Oral ETT  Additional Equipment:   Intra-op Plan:   Post-operative Plan: Extubation in OR  Informed Consent: I have reviewed the patients History and Physical, chart, labs and discussed the procedure including the risks, benefits and alternatives for the proposed anesthesia with the patient or authorized representative who has indicated his/her understanding and acceptance.   Dental advisory given  Plan Discussed with: CRNA  Anesthesia Plan Comments: (Risks/benefits of general anesthesia discussed  with patient including risk of damage to teeth, lips, gum, and tongue, nausea/vomiting, allergic reactions to medications, and the possibility of heart attack, stroke and death.  All patient questions answered.  Patient wishes to proceed.)        Anesthesia Quick Evaluation

## 2015-07-06 NOTE — Care Management Note (Signed)
Case Management Note  Patient Details  Name: Geonna Dickel MRN: [id number] Date of Birth: [birth year]  Subjective/Objective:   69 y/o f admitted w/L femur fx. Ortho-ORIF. From home. Await recc post sx.   CSW following.              Action/Plan:d/c plan likely SNF.   Expected Discharge Date:   (unknown)               Expected Discharge Plan:  Skilled Nursing Facility  In-House Referral:  Clinical Social Work  Discharge planning Services  CM Consult  Post Acute Care Choice:    Choice offered to:     DME Arranged:    DME Agency:     HH Arranged:    [facility] Agency:     Status of Service:  In process, will continue to follow  Medicare Important Message Given:    Date Medicare IM Given:    Medicare IM give by:    Date Additional Medicare IM Given:    Additional Medicare Important Message give by:     If discussed at [place] of Stay Meetings, dates discussed:    Additional Comments:  Dessa Phi, RN 07/06/2015, 10:37 AM

## 2015-07-06 NOTE — H&P (View-Only) (Signed)
ORTHOPAEDIC CONSULTATION  REQUESTING PHYSICIAN: Cristal Ford, DO  PCP:  No primary care provider on file.  Chief Complaint: Left femur fracture  HPI: [name] is a 69 y.o. female who complains of left thigh pain and deformity after slipping and falling in a grocery store yesterday. Has history of breast cancer in early 2000s that recurred in 2013. Has known mets to pelvis. Is evaluated by oncologist at [facility] with CT scan every 6 months. Denies left thigh pain prior to fall.   Past Medical History  Diagnosis Date  . Hypertension   . Cancer [facility])    Past Surgical History  Procedure Laterality Date  . Breast surgery    . Tubal ligation     Social History   Social History  . Marital Status: Married    Spouse Name: N/A  . Number of Children: N/A  . Years of Education: N/A   Social History Main Topics  . Smoking status: Never Smoker   . Smokeless tobacco: None  . Alcohol Use: Yes  . Drug Use: No  . Sexual Activity: Not Asked   Other Topics Concern  . None   Social History Narrative  . None   No family history on file. Allergies  Allergen Reactions  . Keflex [Cephalexin] Diarrhea   Prior to Admission medications   Medication Sig Start Date End Date Taking? Authorizing Provider  bismuth subsalicylate (PEPTO BISMOL) 262 MG/15ML suspension Take 30 mLs by mouth every 6 (six) hours as needed for indigestion.   Yes Historical Provider, MD  Calcium Carb-Cholecalciferol (CALCIUM 600 + D PO) Take 1 tablet by mouth 2 (two) times daily.   Yes Historical Provider, MD  exemestane (AROMASIN) 25 MG tablet Take 25 mg by mouth daily. 07/04/15  Yes Historical Provider, MD  lisinopril (PRINIVIL,ZESTRIL) 20 MG tablet Take 20 mg by mouth daily. 06/01/15  Yes Historical Provider, MD  metoprolol tartrate (LOPRESSOR) 25 MG tablet Take 25 mg by mouth 2 (two) times daily.   Yes Historical Provider, MD  naproxen sodium (ANAPROX) 220 MG tablet Take 440 mg by mouth daily as needed  (pain).   Yes Historical Provider, MD  Probiotic Product (PROBIOTIC PO) Take 1 capsule by mouth daily.   Yes Historical Provider, MD   Dg Chest 1 View  07/05/2015  CLINICAL DATA:  Preop for hip fracture. EXAM: CHEST 1 VIEW COMPARISON:  None. FINDINGS: Cardiomegaly with negative aortic and hilar contours. There is no edema, consolidation, effusion, or pneumothorax. Left axillary dissection. No acute osseous findings at the chest. IMPRESSION: 1. No active disease. 2. Mild cardiomegaly. Electronically Signed   By: Monte Fantasia M.D.   On: 07/05/2015 17:20   Dg Hip Unilat With Pelvis 2-3 Views Left  07/05/2015  CLINICAL DATA:  Status post fall. EXAM: DG HIP (WITH OR WITHOUT PELVIS) 2-3V LEFT COMPARISON:  None. FINDINGS: There is no left hip fracture or dislocation. There is a displaced and angulated fracture of the left proximal femoral diaphysis with 15 mm of lateral and 3 cm of posterior displacement and apex lateral angulation. There are sclerotic lesions scattered throughout the pelvis and proximal bilateral femurs most concerning for metastatic disease. IMPRESSION: 1. Displaced and angulated fracture of the left proximal femoral diaphysis . 2. 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 Electronically Signed   By: Kathreen Devoid   On: 07/05/2015 17:24   Dg Femur Min 2 Views Left  07/05/2015  CLINICAL DATA:  69 year old female with  history of trauma from a fall complaining of left upper leg pain. EXAM: LEFT FEMUR 2 VIEWS COMPARISON:  No priors. FINDINGS: Two nonstandard views of the left femur are submitted for evaluation. There is a displaced foreshortened fracture in the proximal third of the left femoral diaphysis, with approximately 1 shaft width of posterior displacement and at least 3.9 cm of proximal migration of the distal femoral fragment, and approximately 30 degrees of apex anterolateral angulation. Femoral head appears located.  IMPRESSION: 1. Acute displaced foreshortened and angulated fracture of the proximal third of the left femoral diaphysis, as above. Electronically Signed   By: Vinnie Langton M.D.   On: 07/05/2015 17:22    Positive ROS: All other systems have been reviewed and were otherwise negative with the exception of those mentioned in the HPI and as above.  Physical Exam: General: Alert, no acute distress Cardiovascular: No pedal edema Respiratory: No cyanosis, no use of accessory musculature GI: No organomegaly, abdomen is soft and non-tender Skin: No lesions in the area of chief complaint Neurologic: Sensation intact distally Psychiatric: Patient is competent for consent with normal mood and affect Lymphatic: No axillary or cervical lymphadenopathy  MUSCULOSKELETAL: LLE: obvious deformity to thigh. Malrotated. +TA/GS/EHL. 2+ DP. SILT.  Assessment: Subtrochanteric left femur fx Metastatic breast CA  Plan: NPO Hold anticoags CT L femur to rule out pathologic fracture Plan for OR today, open reduction IM nail L femur    The risks, benefits, and alternatives were discussed with the patient. There are risks associated with the surgery including, but not limited to, problems with anesthesia (death), infection, differences in leg length/angulation/rotation, fracture of bones, loosening or failure of implants, hematoma (blood accumulation) which may require surgical drainage, blood clots, pulmonary embolism, nerve injury (foot drop), and blood vessel injury. The patient understands these risks and elects to proceed.  Mickey Hebel, Horald Pollen, MD Cell [phone]    07/06/2015 7:27 AM

## 2015-07-06 NOTE — Progress Notes (Signed)
Patient in and out of bigeminy. Patient asymptomatic. MD Notified

## 2015-07-06 NOTE — Progress Notes (Signed)
  Echocardiogram 2D Echocardiogram has been performed.  Bobbye Charleston 07/06/2015, 1:29 PM

## 2015-07-06 NOTE — Transfer of Care (Signed)
Immediate Anesthesia Transfer of Care Note  Patient: [name]  Procedure(s) Performed: Procedure(s): INTRAMEDULLARY (IM) NAIL FEMORAL (Left)  Patient Location: PACU  Anesthesia Type:General  Level of Consciousness: awake, alert , oriented and patient cooperative  Airway & Oxygen Therapy: Patient Spontanous Breathing and Patient connected to face mask oxygen  Post-op Assessment: Report given to RN, Post -op Vital signs reviewed and stable and Patient moving all extremities X 4  Post vital signs: stable  Last Vitals:  Filed Vitals:   07/06/15 1324 07/06/15 2052  BP: 123/56 109/49  Pulse: 80 77  Temp: 37.3 C 36.3 C  Resp: 18 16    Complications: No apparent anesthesia complications

## 2015-07-06 NOTE — Anesthesia Postprocedure Evaluation (Signed)
Anesthesia Post Note  Patient: Jontel Hartshorn  Procedure(s) Performed: Procedure(s) (LRB): INTRAMEDULLARY (IM) NAIL FEMORAL (Left)  Patient location during evaluation: PACU Anesthesia Type: General Level of consciousness: awake, sedated and patient cooperative Pain management: pain level controlled Vital Signs Assessment: post-procedure vital signs reviewed and stable Respiratory status: spontaneous breathing, nonlabored ventilation, respiratory function stable and patient connected to face mask oxygen Cardiovascular status: blood pressure returned to baseline and stable Postop Assessment: no signs of nausea or vomiting Anesthetic complications: no    Last Vitals:  Filed Vitals:   07/06/15 2130 07/06/15 2141  BP: 138/71 126/72  Pulse: 71 72  Temp: 36.8 C 36.6 C  Resp: 14 14    Last Pain:  Filed Vitals:   07/06/15 2150  PainSc: 1     LLE Motor Response: Purposeful movement LLE Sensation: Full sensation          Abdirizak Richison A.

## 2015-07-06 NOTE — Progress Notes (Signed)
CSW met with patient and her husband prior to ORIF surgery to discuss possible need for SNF rehab. Patient and her husband reside in [place], [place] has completed FL2 and PASARR for possible SNF- full assessment to follow-  Eduard Clos, MSW, [place]

## 2015-07-06 NOTE — Brief Op Note (Signed)
07/05/2015 - 07/06/2015  8:37 PM  PATIENT:  [name]  69 y.o. female  PRE-OPERATIVE DIAGNOSIS:  left femur fracture  POST-OPERATIVE DIAGNOSIS:  left femur fracture  PROCEDURE:  Procedure(s): INTRAMEDULLARY (IM) NAIL FEMORAL (Left)  SURGEON:  Surgeon(s) and Role:    * Rod Can, MD - Primary  PHYSICIAN ASSISTANT: none  ASSISTANTS: none   ANESTHESIA:   general  EBL:  Total I/O In: -  Out: 400 [Urine:150; Blood:250]  BLOOD ADMINISTERED:none  DRAINS: none   LOCAL MEDICATIONS USED:  NONE  SPECIMEN:  Source of Specimen:  proximal femoral reamings  DISPOSITION OF SPECIMEN:  PATHOLOGY  COUNTS:  YES  TOURNIQUET:  * No tourniquets in log *  DICTATION: .Other Dictation: Dictation Number [id number]  PLAN OF CARE: Admit to inpatient   PATIENT DISPOSITION:  PACU - hemodynamically stable.   Delay start of Pharmacological VTE agent (>24hrs) due to surgical blood loss or risk of bleeding: no

## 2015-07-06 NOTE — Interval H&P Note (Signed)
History and Physical Interval Note:  07/06/2015 6:45 PM  [facility]  has presented today for surgery, with the diagnosis of left femur fracture  The various methods of treatment have been discussed with the patient and family. After consideration of risks, benefits and other options for treatment, the patient has consented to  Procedure(s): INTRAMEDULLARY (IM) NAIL FEMORAL (Left) as a surgical intervention .  The patient's history has been reviewed, patient examined, no change in status, stable for surgery.  I have reviewed the patient's chart and labs.  Questions were answered to the patient's satisfaction.     Hallel Denherder, Horald Pollen

## 2015-07-06 NOTE — Progress Notes (Signed)
[facility]                                                                              Patient Demographics  [name], is a 69 y.o. female, DOB - Jan 28, 1946, [id number]  Admit date - 07/05/2015   Admitting Physician Reubin Milan, MD  Outpatient Primary MD for the patient is No primary care provider on file.  LOS - 1   Chief Complaint  Patient presents with  . Fall  . Hip Injury      HPI on 07/05/2015 by Dr. Gerri Lins [name] is a 69 y.o. female with a past medical history of breast cancer, hypertension, unspecified arrhythmia (which the patient stated was due to anxiety) who was brought to the emergency department via EMS after she fell at a grocery store while she was walking on a slippery floor. Per patient, she fell on her left side, had immediate intense pain, noticed a deformity in her thigh and was unable to get up on her own. She states that she did not lose consciousness or have head trauma during the fall. She denies having pre-fall chest pain, palpitations, dizziness, dyspnea, diaphoresis, nausea or emesis. She states that this was purely an accident.  During transportation to the emergency department, the patient was given intranasal fentanyl which helped relieve her pain. EMS described 7 beat nonsustained ventricular tachycardia run, with other smaller runs noticed on telemetry in the emergency department alternating with ventricular bigeminy. The patient is taking metoprolol 25 mg by mouth twice a day, but had not taken the last 2 scheduled doses prior to her accident. She has a previous history of tachyarrhythmia, but workup done several years ago apparently did not show any CAD.   When seen in the emergency department, the patient was in no acute distress. She is states that she was in no pain, as long as she did not move her left lower extremity.  Assessment & Plan   Left femur fracture -Secondary to fall -Left pelvic xray: Displaced and  angulated fracture of the left proximal femoral diaphysis -Orthopedic surgery consulted and appreciated -Plan is for surgery later today -Patient is moderate risk for surgery, given history of nonsustained ventricular tachycardia and cancer -Continue pain control -Will likely consult PT and OT for evaluation starting on 07/07/2015  Ventricular bigeminy/nonsustained ventricular tachycardia -Secondary to missed doses of metoprolol -Continue home regimen -Potassium 3.8, magnesium 1.8  Metastatic breast cancer -Continue Aromasin and Xgeva injections -Patient follows up with oncology at [place] hypertension -Continue metoprolol and lisinopril  Code Status: Full  Family Communication: Husband at bedside  Disposition Plan: Admitted. Surgery today  Time Spent in minutes   30 minutes  Procedures  None  Consults   Orthopedic surgery  DVT Prophylaxis  SCDs  Lab Results  Component Value Date   PLT 248 07/06/2015    Medications  Scheduled Meds: . acidophilus  1 capsule Oral Daily  . calcium-vitamin D   Oral BID  .  ceFAZolin (ANCEF) IV  2 g Intravenous To OR  . exemestane  25 mg Oral Daily  . lisinopril  20 mg Oral Daily  . metoprolol tartrate  25 mg Oral BID  . pantoprazole  40 mg Oral Daily  . sodium chloride  3 mL Intravenous Q12H   Continuous Infusions: . 0.9 % NaCl with KCl 20 mEq / L 75 mL/hr at 07/05/15 2255   PRN Meds:.bismuth subsalicylate, morphine injection  Antibiotics    Anti-infectives    Start     Dose/Rate Route Frequency Ordered Stop   07/06/15 1600  ceFAZolin (ANCEF) IVPB 2 g/50 mL premix     2 g 100 mL/hr over 30 Minutes Intravenous To Surgery 07/06/15 0733 07/07/15 1600      Subjective:   [name] seen and examined today.  Patient denies any chest pain, shortness breath, abdominal pain, nausea or vomiting, diarrhea or constipation. Patient states that she had missed her metoprolol. She currently denies any palpitations, dizziness or  lightheadedness. She does complain of some pain when moving her left leg.  Objective:   Filed Vitals:   07/05/15 1855 07/05/15 1927 07/05/15 1958 07/06/15 0447  BP: 146/78 129/67 131/69 127/62  Pulse: 58 90 82 88  Temp: 97.9 F (36.6 C)  99.5 F (37.5 C) 99 F (37.2 C)  TempSrc: Oral  Oral Oral  Resp: 16 16 19 18   Height:   5' (1.524 m)   Weight:   56.5 kg (124 lb 9 oz)   SpO2: 98% 93% 98% 99%    Wt Readings from Last 3 Encounters:  07/05/15 56.5 kg (124 lb 9 oz)     Intake/Output Summary (Last 24 hours) at 07/06/15 1026 Last data filed at 07/06/15 0900  Gross per 24 hour  Intake      0 ml  Output   2475 ml  Net  -2475 ml    Exam  General: Well developed, well nourished, NAD, appears stated age  HEENT: NCAT, mucous membranes moist.   Cardiovascular: S1 S2 auscultated, Irregular  Respiratory: Clear to auscultation bilaterally with equal chest rise  Abdomen: Soft, nontender, nondistended, + bowel sounds  Extremities: warm dry without cyanosis clubbing or edema.  LLE externally rotated-decreased ROM  Neuro: AAOx3, Nonfocal  Psych: Normal affect and demeanor   Data Review   Micro Results No results found for this or any previous visit (from the past 240 hour(s)).  Radiology Reports Dg Chest 1 View  07/05/2015  CLINICAL DATA:  Preop for hip fracture. EXAM: CHEST 1 VIEW COMPARISON:  None. FINDINGS: Cardiomegaly with negative aortic and hilar contours. There is no edema, consolidation, effusion, or pneumothorax. Left axillary dissection. No acute osseous findings at the chest. IMPRESSION: 1. No active disease. 2. Mild cardiomegaly. Electronically Signed   By: Monte Fantasia M.D.   On: 07/05/2015 17:20   Dg Hip Unilat With Pelvis 2-3 Views Left  07/05/2015  CLINICAL DATA:  Status post fall. EXAM: DG HIP (WITH OR WITHOUT PELVIS) 2-3V LEFT COMPARISON:  None. FINDINGS: There is no left hip fracture or dislocation. There is a displaced and angulated fracture of the  left proximal femoral diaphysis with 15 mm of lateral and 3 cm of posterior displacement and apex lateral angulation. There are sclerotic lesions scattered throughout the pelvis and proximal bilateral femurs most concerning for metastatic disease. IMPRESSION: 1. Displaced and angulated fracture of the left proximal femoral diaphysis . 2. 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 Electronically Signed   By: Kathreen Devoid   On: 07/05/2015 17:24   Dg Femur Min 2 Views Left  07/05/2015  CLINICAL DATA:  69 year old female with  history of trauma from a fall complaining of left upper leg pain. EXAM: LEFT FEMUR 2 VIEWS COMPARISON:  No priors. FINDINGS: Two nonstandard views of the left femur are submitted for evaluation. There is a displaced foreshortened fracture in the proximal third of the left femoral diaphysis, with approximately 1 shaft width of posterior displacement and at least 3.9 cm of proximal migration of the distal femoral fragment, and approximately 30 degrees of apex anterolateral angulation. Femoral head appears located. IMPRESSION: 1. Acute displaced foreshortened and angulated fracture of the proximal third of the left femoral diaphysis, as above. Electronically Signed   By: Vinnie Langton M.D.   On: 07/05/2015 17:22    CBC  Recent Labs Lab 07/05/15 1732 07/06/15 0501  WBC 9.2 8.9  HGB 12.7 12.6  HCT 37.4 37.3  PLT 220 248  MCV 94.4 95.2  MCH 32.1 32.1  MCHC 34.0 33.8  RDW 12.1 12.2  LYMPHSABS 1.5  --   MONOABS 0.3  --   EOSABS 0.0  --   BASOSABS 0.0  --     Chemistries   Recent Labs Lab 07/05/15 1732 07/05/15 1832 07/06/15 0501  NA 142  --  138  K 3.5  --  3.8  CL 108  --  108  CO2 27  --  22  GLUCOSE 132*  --  103*  BUN 13  --  14  CREATININE 0.60  --  0.64  CALCIUM 8.9  --  8.3*  MG  --  1.8  --   AST  --   --  20  ALT  --   --  9*  ALKPHOS  --   --  45  BILITOT  --   --  0.8    ------------------------------------------------------------------------------------------------------------------ estimated creatinine clearance is 50.8 mL/min (by C-G formula based on Cr of 0.64). ------------------------------------------------------------------------------------------------------------------ No results for input(s): HGBA1C in the last 72 hours. ------------------------------------------------------------------------------------------------------------------ No results for input(s): CHOL, HDL, LDLCALC, TRIG, CHOLHDL, LDLDIRECT in the last 72 hours. ------------------------------------------------------------------------------------------------------------------ No results for input(s): TSH, T4TOTAL, T3FREE, THYROIDAB in the last 72 hours.  Invalid input(s): FREET3 ------------------------------------------------------------------------------------------------------------------ No results for input(s): VITAMINB12, FOLATE, FERRITIN, TIBC, IRON, RETICCTPCT in the last 72 hours.  Coagulation profile  Recent Labs Lab 07/05/15 1732  INR 1.03    No results for input(s): DDIMER in the last 72 hours.  Cardiac Enzymes  Recent Labs Lab 07/05/15 1732 07/05/15 2310 07/06/15 0501  TROPONINI <0.03 <0.03 0.03   ------------------------------------------------------------------------------------------------------------------ Invalid input(s): POCBNP    Gracemarie Skeet D.O. on 07/06/2015 at 10:26 AM  Between 7am to 7pm - Pager - [phone]  After 7pm go to [email or web address] - password [email or web address]  And look for the night coverage person covering for me after hours  [facility] Office  [phone]

## 2015-07-06 NOTE — Consult Note (Signed)
ORTHOPAEDIC CONSULTATION  REQUESTING PHYSICIAN: Cristal Ford, DO  PCP:  No primary care provider on file.  Chief Complaint: Left femur fracture  HPI: [name] is a 69 y.o. female who complains of left thigh pain and deformity after slipping and falling in a grocery store yesterday. Has history of breast cancer in early 2000s that recurred in 2013. Has known mets to pelvis. Is evaluated by oncologist at [facility] with CT scan every 6 months. Denies left thigh pain prior to fall.   Past Medical History  Diagnosis Date  . Hypertension   . Cancer [facility])    Past Surgical History  Procedure Laterality Date  . Breast surgery    . Tubal ligation     Social History   Social History  . Marital Status: Married    Spouse Name: N/A  . Number of Children: N/A  . Years of Education: N/A   Social History Main Topics  . Smoking status: Never Smoker   . Smokeless tobacco: None  . Alcohol Use: Yes  . Drug Use: No  . Sexual Activity: Not Asked   Other Topics Concern  . None   Social History Narrative  . None   No family history on file. Allergies  Allergen Reactions  . Keflex [Cephalexin] Diarrhea   Prior to Admission medications   Medication Sig Start Date End Date Taking? Authorizing Provider  bismuth subsalicylate (PEPTO BISMOL) 262 MG/15ML suspension Take 30 mLs by mouth every 6 (six) hours as needed for indigestion.   Yes Historical Provider, MD  Calcium Carb-Cholecalciferol (CALCIUM 600 + D PO) Take 1 tablet by mouth 2 (two) times daily.   Yes Historical Provider, MD  exemestane (AROMASIN) 25 MG tablet Take 25 mg by mouth daily. 07/04/15  Yes Historical Provider, MD  lisinopril (PRINIVIL,ZESTRIL) 20 MG tablet Take 20 mg by mouth daily. 06/01/15  Yes Historical Provider, MD  metoprolol tartrate (LOPRESSOR) 25 MG tablet Take 25 mg by mouth 2 (two) times daily.   Yes Historical Provider, MD  naproxen sodium (ANAPROX) 220 MG tablet Take 440 mg by mouth daily as needed  (pain).   Yes Historical Provider, MD  Probiotic Product (PROBIOTIC PO) Take 1 capsule by mouth daily.   Yes Historical Provider, MD   Dg Chest 1 View  07/05/2015  CLINICAL DATA:  Preop for hip fracture. EXAM: CHEST 1 VIEW COMPARISON:  None. FINDINGS: Cardiomegaly with negative aortic and hilar contours. There is no edema, consolidation, effusion, or pneumothorax. Left axillary dissection. No acute osseous findings at the chest. IMPRESSION: 1. No active disease. 2. Mild cardiomegaly. Electronically Signed   By: Monte Fantasia M.D.   On: 07/05/2015 17:20   Dg Hip Unilat With Pelvis 2-3 Views Left  07/05/2015  CLINICAL DATA:  Status post fall. EXAM: DG HIP (WITH OR WITHOUT PELVIS) 2-3V LEFT COMPARISON:  None. FINDINGS: There is no left hip fracture or dislocation. There is a displaced and angulated fracture of the left proximal femoral diaphysis with 15 mm of lateral and 3 cm of posterior displacement and apex lateral angulation. There are sclerotic lesions scattered throughout the pelvis and proximal bilateral femurs most concerning for metastatic disease. IMPRESSION: 1. Displaced and angulated fracture of the left proximal femoral diaphysis . 2. 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 Electronically Signed   By: Kathreen Devoid   On: 07/05/2015 17:24   Dg Femur Min 2 Views Left  07/05/2015  CLINICAL DATA:  69 year old female with  history of trauma from a fall complaining of left upper leg pain. EXAM: LEFT FEMUR 2 VIEWS COMPARISON:  No priors. FINDINGS: Two nonstandard views of the left femur are submitted for evaluation. There is a displaced foreshortened fracture in the proximal third of the left femoral diaphysis, with approximately 1 shaft width of posterior displacement and at least 3.9 cm of proximal migration of the distal femoral fragment, and approximately 30 degrees of apex anterolateral angulation. Femoral head appears located.  IMPRESSION: 1. Acute displaced foreshortened and angulated fracture of the proximal third of the left femoral diaphysis, as above. Electronically Signed   By: Vinnie Langton M.D.   On: 07/05/2015 17:22    Positive ROS: All other systems have been reviewed and were otherwise negative with the exception of those mentioned in the HPI and as above.  Physical Exam: General: Alert, no acute distress Cardiovascular: No pedal edema Respiratory: No cyanosis, no use of accessory musculature GI: No organomegaly, abdomen is soft and non-tender Skin: No lesions in the area of chief complaint Neurologic: Sensation intact distally Psychiatric: Patient is competent for consent with normal mood and affect Lymphatic: No axillary or cervical lymphadenopathy  MUSCULOSKELETAL: LLE: obvious deformity to thigh. Malrotated. +TA/GS/EHL. 2+ DP. SILT.  Assessment: Subtrochanteric left femur fx Metastatic breast CA  Plan: NPO Hold anticoags CT L femur to rule out pathologic fracture Plan for OR today, open reduction IM nail L femur    The risks, benefits, and alternatives were discussed with the patient. There are risks associated with the surgery including, but not limited to, problems with anesthesia (death), infection, differences in leg length/angulation/rotation, fracture of bones, loosening or failure of implants, hematoma (blood accumulation) which may require surgical drainage, blood clots, pulmonary embolism, nerve injury (foot drop), and blood vessel injury. The patient understands these risks and elects to proceed.  Monteen Toops, Horald Pollen, MD Cell [phone]    07/06/2015 7:27 AM

## 2015-07-06 NOTE — Progress Notes (Signed)
Patient returned to room from [facility]. Ice [facility] on left hip. Patient is awake,alert and oriented. PCP was notified for a diet order.

## 2015-07-07 ENCOUNTER — Encounter (HOSPITAL_COMMUNITY): Payer: Self-pay | Admitting: Orthopedic Surgery

## 2015-07-07 DIAGNOSIS — S7222XA Displaced subtrochanteric fracture of left femur, initial encounter for closed fracture: Principal | ICD-10-CM

## 2015-07-07 LAB — BASIC METABOLIC PANEL
ANION GAP: 6 (ref 5–15)
BUN: 11 mg/dL (ref 6–20)
CALCIUM: 7.9 mg/dL — AB (ref 8.9–10.3)
CO2: 27 mmol/L (ref 22–32)
CREATININE: 0.63 mg/dL (ref 0.44–1.00)
Chloride: 108 mmol/L (ref 101–111)
GFR calc Af Amer: >60 mL/min (ref >60)
GFR calc non Af Amer: >60 mL/min (ref >60)
Glucose, Bld: 189 mg/dL — ABNORMAL HIGH (ref 65–99)
Potassium: 3.8 mmol/L (ref 3.5–5.1)
Sodium: 141 mmol/L (ref 135–145)

## 2015-07-07 LAB — CBC
HEMATOCRIT: 34.2 % — AB (ref 36.0–46.0)
HEMOGLOBIN: 11.9 g/dL — AB (ref 12.0–15.0)
MCH: 32.2 pg (ref 26.0–34.0)
MCHC: 34.8 g/dL (ref 30.0–36.0)
MCV: 92.4 fL (ref 78.0–100.0)
Platelets: 183 10*3/uL (ref 150–400)
RBC: 3.7 MIL/uL — ABNORMAL LOW (ref 3.87–5.11)
RDW: 12.2 % (ref 11.5–15.5)
WBC: 9.5 10*3/uL (ref 4.0–10.5)

## 2015-07-07 MED ORDER — CIPROFLOXACIN HCL 500 MG PO TABS
500.0000 mg | ORAL_TABLET | Freq: Two times a day (BID) | ORAL | Status: DC
  Administered 2015-07-07 – 2015-07-08 (×2): 500 mg via ORAL
  Filled 2015-07-07 (×2): qty 1

## 2015-07-07 NOTE — NC FL2 (Signed)
[place] LEVEL OF CARE SCREENING TOOL     IDENTIFICATION  Patient Name: [name] Birthdate: Sep 19, 1945 Sex: female Admission Date (Current Location): 07/05/2015  [place] and [place] Number: [name], [place] and Address:  [name],  [place] [address], [place]      Provider Number: [id number]  Attending Physician Name and Address:  Cristal Ford, DO  Relative Name and Phone Number:       Current Level of Care: Hospital Recommended Level of Care: [place] Prior Approval Number:    Date Approved/Denied:   PASRR Number:    Discharge Plan: SNF    Current Diagnoses: Patient Active Problem List   Diagnosis Date Noted  . Closed left subtrochanteric femur fracture ([place]) 07/06/2015  . Breast cancer ([place]) 07/05/2015  . Femur fracture, left ([place]) 07/05/2015  . Femur fracture, left, closed, initial encounter 07/05/2015  . Hypertension 07/05/2015  . Ventricular bigeminy 07/05/2015  . Non-sustained ventricular tachycardia ([place]) 07/05/2015    Orientation ACTIVITIES/SOCIAL BLADDER RESPIRATION    Self, Time, Situation, Place  Active Continent Normal  BEHAVIORAL SYMPTOMS/MOOD NEUROLOGICAL BOWEL NUTRITION STATUS      Continent    PHYSICIAN VISITS COMMUNICATION OF NEEDS Height & Weight Skin    Verbally 5' (152.4 cm) 124 lbs. Surgical wounds          AMBULATORY STATUS RESPIRATION    Assist extensive Normal      Personal Care Assistance Level of Assistance  Bathing, Dressing Bathing Assistance: Limited assistance   Dressing Assistance: Limited assistance      Functional Limitations Info                SPECIAL CARE FACTORS FREQUENCY  PT (By licensed PT), OT (By licensed OT)     PT Frequency: 5 OT Frequency: 5           Additional Factors Info  Code Status Code Status Info: Full Code             Current Medications (07/07/2015):  This is the current hospital active medication  list Current Facility-Administered Medications  Medication Dose Route Frequency Provider Last Rate Last Dose  . acetaminophen (TYLENOL) tablet 650 mg  650 mg Oral Q6H PRN Rod Can, MD       Or  . acetaminophen (TYLENOL) suppository 650 mg  650 mg Rectal Q6H PRN Rod Can, MD      . acidophilus (RISAQUAD) capsule 1 capsule  1 capsule Oral Daily Reubin Milan, MD   1 capsule at 07/07/15 1018  . bismuth subsalicylate (PEPTO BISMOL) 262 MG/15ML suspension 30 mL  30 mL Oral Q6H PRN Reubin Milan, MD      . calcium-vitamin D (OSCAL WITH D) 500-200 MG-UNIT per tablet   Oral BID Reubin Milan, MD   1 tablet at 07/07/15 1017  . ciprofloxacin (CIPRO) tablet 500 mg  500 mg Oral BID Rod Can, MD      . docusate sodium (COLACE) capsule 100 mg  100 mg Oral BID Rod Can, MD   100 mg at 07/07/15 1017  . enoxaparin (LOVENOX) injection 40 mg  40 mg Subcutaneous Q24H Rod Can, MD   40 mg at 07/07/15 [id number]  . exemestane (AROMASIN) tablet 25 mg  25 mg Oral Daily Reubin Milan, MD   25 mg at 07/07/15 1018  . lisinopril (PRINIVIL,ZESTRIL) tablet 20 mg  20 mg Oral Daily Reubin Milan, MD   20 mg at 07/07/15  1017  . menthol-cetylpyridinium (CEPACOL) lozenge 3 mg  1 lozenge Oral PRN Rod Can, MD       Or  . phenol (CHLORASEPTIC) mouth spray 1 spray  1 spray Mouth/Throat PRN Rod Can, MD      . metoCLOPramide (REGLAN) tablet 5-10 mg  5-10 mg Oral Q8H PRN Rod Can, MD       Or  . metoCLOPramide (REGLAN) injection 5-10 mg  5-10 mg Intravenous Q8H PRN Rod Can, MD      . metoprolol tartrate (LOPRESSOR) tablet 25 mg  25 mg Oral BID Reubin Milan, MD   25 mg at 07/07/15 1016  . morphine 2 MG/ML injection 0.5 mg  0.5 mg Intravenous Q2H PRN Rod Can, MD   0.5 mg at 07/07/15 0011  . ondansetron (ZOFRAN) tablet 4 mg  4 mg Oral Q6H PRN Rod Can, MD       Or  . ondansetron [facility]) injection 4 mg  4 mg Intravenous Q6H PRN Rod Can,  MD      . oxyCODONE (Oxy IR/ROXICODONE) immediate release tablet 5-10 mg  5-10 mg Oral Q4H PRN Rod Can, MD   10 mg at 07/07/15 0940  . pantoprazole (PROTONIX) EC tablet 40 mg  40 mg Oral Daily Reubin Milan, MD   40 mg at 07/07/15 1018  . senna (SENOKOT) tablet 8.6 mg  1 tablet Oral BID Rod Can, MD   8.6 mg at 07/07/15 1017     Discharge Medications: Please see discharge summary for a list of discharge medications.  Relevant Imaging Results:  Relevant Lab Results:  Recent Labs    Additional Information SSN [id number]  Ludwig Clarks, LCSW

## 2015-07-07 NOTE — NC FL2 (Deleted)
[name] LEVEL OF CARE SCREENING TOOL     IDENTIFICATION  Patient Name: [name] Birthdate: Jan 12, 1946 Sex: female Admission Date (Current Location): 07/05/2015  [place] and [place] Number: [name], [place] and Address:  [facility],  [place] [address], [place]      Provider Number: [id number]  Attending Physician Name and Address:  Cristal Ford, DO  Relative Name and Phone Number:       Current Level of Care: Hospital Recommended Level of Care: [place] Prior Approval Number:    Date Approved/Denied:   PASRR Number:    Discharge Plan: SNF    Current Diagnoses: Patient Active Problem List   Diagnosis Date Noted  . Closed left subtrochanteric femur fracture ([place]) 07/06/2015  . Breast cancer ([place]) 07/05/2015  . Femur fracture, left ([place]) 07/05/2015  . Femur fracture, left, closed, initial encounter 07/05/2015  . Hypertension 07/05/2015  . Ventricular bigeminy 07/05/2015  . Non-sustained ventricular tachycardia ([place]) 07/05/2015    Orientation ACTIVITIES/SOCIAL BLADDER RESPIRATION    Self, Time, Situation, Place  Active Continent Normal  BEHAVIORAL SYMPTOMS/MOOD NEUROLOGICAL BOWEL NUTRITION STATUS      Continent    PHYSICIAN VISITS COMMUNICATION OF NEEDS Height & Weight Skin    Verbally 5' (152.4 cm) 124 lbs. Surgical wounds          AMBULATORY STATUS RESPIRATION    Assist extensive Normal      Personal Care Assistance Level of Assistance  Bathing, Dressing Bathing Assistance: Limited assistance   Dressing Assistance: Limited assistance      Functional Limitations Info                SPECIAL CARE FACTORS FREQUENCY  PT (By licensed PT), OT (By licensed OT)     PT Frequency: 5 OT Frequency: 5           Additional Factors Info  Code Status Code Status Info: Full Code             Current Medications (07/07/2015):  This is the current hospital active medication  list Current Facility-Administered Medications  Medication Dose Route Frequency Provider Last Rate Last Dose  . acetaminophen (TYLENOL) tablet 650 mg  650 mg Oral Q6H PRN Rod Can, MD       Or  . acetaminophen (TYLENOL) suppository 650 mg  650 mg Rectal Q6H PRN Rod Can, MD      . acidophilus (RISAQUAD) capsule 1 capsule  1 capsule Oral Daily Reubin Milan, MD   1 capsule at 07/07/15 1018  . bismuth subsalicylate (PEPTO BISMOL) 262 MG/15ML suspension 30 mL  30 mL Oral Q6H PRN Reubin Milan, MD      . calcium-vitamin D (OSCAL WITH D) 500-200 MG-UNIT per tablet   Oral BID Reubin Milan, MD   1 tablet at 07/07/15 1017  . ciprofloxacin (CIPRO) tablet 500 mg  500 mg Oral BID Rod Can, MD      . docusate sodium (COLACE) capsule 100 mg  100 mg Oral BID Rod Can, MD   100 mg at 07/07/15 1017  . enoxaparin (LOVENOX) injection 40 mg  40 mg Subcutaneous Q24H Rod Can, MD   40 mg at 07/07/15 [id number]  . exemestane (AROMASIN) tablet 25 mg  25 mg Oral Daily Reubin Milan, MD   25 mg at 07/07/15 1018  . lisinopril (PRINIVIL,ZESTRIL) tablet 20 mg  20 mg Oral Daily Reubin Milan, MD   20 mg at 07/07/15  1017  . menthol-cetylpyridinium (CEPACOL) lozenge 3 mg  1 lozenge Oral PRN Rod Can, MD       Or  . phenol (CHLORASEPTIC) mouth spray 1 spray  1 spray Mouth/Throat PRN Rod Can, MD      . metoCLOPramide (REGLAN) tablet 5-10 mg  5-10 mg Oral Q8H PRN Rod Can, MD       Or  . metoCLOPramide (REGLAN) injection 5-10 mg  5-10 mg Intravenous Q8H PRN Rod Can, MD      . metoprolol tartrate (LOPRESSOR) tablet 25 mg  25 mg Oral BID Reubin Milan, MD   25 mg at 07/07/15 1016  . morphine 2 MG/ML injection 0.5 mg  0.5 mg Intravenous Q2H PRN Rod Can, MD   0.5 mg at 07/07/15 0011  . ondansetron (ZOFRAN) tablet 4 mg  4 mg Oral Q6H PRN Rod Can, MD       Or  . ondansetron [facility]) injection 4 mg  4 mg Intravenous Q6H PRN Rod Can,  MD      . oxyCODONE (Oxy IR/ROXICODONE) immediate release tablet 5-10 mg  5-10 mg Oral Q4H PRN Rod Can, MD   10 mg at 07/07/15 0940  . pantoprazole (PROTONIX) EC tablet 40 mg  40 mg Oral Daily Reubin Milan, MD   40 mg at 07/07/15 1018  . senna (SENOKOT) tablet 8.6 mg  1 tablet Oral BID Rod Can, MD   8.6 mg at 07/07/15 1017     Discharge Medications: Please see discharge summary for a list of discharge medications.  Relevant Imaging Results:  Relevant Lab Results:  Recent Labs    Additional Information SSN [id number]  Ludwig Clarks, LCSW

## 2015-07-07 NOTE — Clinical Social Work Placement (Signed)
   CLINICAL SOCIAL WORK PLACEMENT  NOTE  Date:  07/07/2015  Patient Details  Name: [name] MRN: [id number] Date of Birth: 1945/09/21  Clinical Social Work is seeking post-discharge placement for this patient at the [place] level of care (*CSW will initial, date and re-position this form in  chart as items are completed):  Yes   Patient/family provided with [place] Work Department's list of facilities offering this level of care within the geographic area requested by the patient (or if unable, by the patient's family).  Yes   Patient/family informed of their freedom to choose among providers that offer the needed level of care, that participate in Medicare, Medicaid or managed care program needed by the patient, have an available bed and are willing to accept the patient.  Yes   Patient/family informed of [facility]'s ownership interest in [facility] and [facility], as well as of the fact that they are under no obligation to receive care at these facilities.  PASRR submitted to EDS on       PASRR number received on       Existing PASRR number confirmed on       FL2 transmitted to all facilities in geographic area requested by pt/family on 07/07/15     FL2 transmitted to all facilities within larger geographic area on       Patient informed that his/her managed care company has contracts with or will negotiate with certain facilities, including the following:            Patient/family informed of bed offers received.  Patient chooses bed at       Physician recommends and patient chooses bed at      Patient to be transferred to   on  .  Patient to be transferred to facility by       Patient family notified on   of transfer.  Name of family member notified:        PHYSICIAN Please sign FL2, Please prepare priority discharge summary, including medications     Additional Comment:     _______________________________________________ Ludwig Clarks, LCSW 07/07/2015, 3:59 PM

## 2015-07-07 NOTE — Op Note (Signed)
[name], [name] NO.:  [id number]  MEDICAL RECORD NO.:  [id number]  LOCATION:  [id number]                         FACILITY:  [facility]  PHYSICIAN:  Rod Can, MD     DATE OF BIRTH:  06/19/1946  DATE OF PROCEDURE:  07/06/2015 DATE OF DISCHARGE:                              OPERATIVE REPORT   SURGEON:  Rod Can, MD.  ASSISTANT:  None.  PREOPERATIVE DIAGNOSIS:  Left subtrochanteric femur fracture.  POSTOPERATIVE DIAGNOSIS:  Left subtrochanteric femur fracture.  PROCEDURE PERFORMED:  Intramedullary fixation of left subtrochanteric femur fracture.  IMPLANTS: 1. Synthes TFNA 9 x 320 mm nail, 130 degrees. 2. TFNA screw 90 mm. 3. A 5 mm distal interlocking screw x1.  ANESTHESIA:  General.  ESTIMATED BLOOD LOSS:  200 mL.  SPECIMENS:  Proximal femoral reamings for permanent pathology.  COMPLICATIONS:  None.  DISPOSITION:  Stable to PACU.  ANTIBIOTICS:  2 g of Ancef.  INDICATIONS:  The patient is a very pleasant 69 year old female with a known history of metastatic breast cancer.  She is treated by an oncologist at [facility].  She has known metastasis to the pelvis and femoral heads on both sides.  She was walking in a grocery store yesterday, slipped on a wet floor, injured her left lower extremity.  She had immediate pain and deformity to the left thigh, she was unable to weight bear.  She was brought to the hospital and workup revealed a subtrochanteric femur fracture.  She was admitted to the Hospitalist Service.  I ordered a CT scan of the femur with and without IV contrast to evaluate for metastasis to the fractured region.  She was found to have femoral head and neck metastasis, but there was no evidence of pathologic fracture through the subtrochanteric region of the femur. She underwent perioperative risk stratification, medical optimization. Risks, benefits, and alternatives to surgical fixation were explained and she elected to  proceed.  DESCRIPTION OF PROCEDURE IN DETAIL:  The patient was identified in the holding area using 2 identifiers.  Surgical site was marked by myself. She was taken to the operating room and general anesthesia was induced on the bed.  A Foley was placed.  She was placed on the Hana table.  The right lower extremity was scissored underneath the left.  This fracture was reduced with a standard traction, neutral rotation and abduction. The left lower extremity was prepped and draped in normal surgical fashion.  Time-out was called verifying side and site of surgery.  She did receive IV antibiotics within 60 minutes of beginning the procedure. I began by making a 4 cm incision proximal to the tip of the trochanter. I used a guide pin to establish the standard starting point for trochanteric entry nail.  Entry reamer was used.  I sent the proximal femoral reamings for permanent pathology.  I reduced the femur using an F tool as well as the intramedullary reduction finger.  I passed the guidewire to the physeal scar.  I measured the length.  I reamed sequentially up to 10.5 mm, with excellent chatter.  The real 9 mm x 320 mm nail was selected and this was easily  impacted into place.  Through a separate stab incision, I inserted the cannula down to the bone for the cephalomedullary device.  I placed a guide pin center-center.  No chondral penetration to apex, distance appropriate.  I measured the length of the screw.  I reamed, I placed the real screw.  At this point, the fracture was nearly anatomically reduced.  I did have some distraction at the fracture site.  I tightened the set screw.  I removed the proximal jig, I then let down all the traction.  I manually impacted through her heel, did close the fracture gap down as much as possible. I used a perfect circle technique to place 1 distal interlocking screw. The final AP and lateral fluoroscopy views were obtained.  The fracture was  nearly anatomically reduced.  Tip apex distance of the cephalomedullary device was appropriate.  No chondral penetration. Wounds were copiously irrigated with saline.  The wounds were closed in layers using #1 Vicryl for the fascia, 2-0 Monocryl for the deep dermal layer, 3-0 running Monocryl subcuticular stitch.  I applied Dermabond. Once the glue was fully dried, Mepilex dressing were applied.  The patient was transferred to her bed, extubated, taken to PACU in stable condition.  Sponge, needle, and instrument counts correct at the end of the case x2.  There were no known complications.  PLAN:  I discussed the operative events and findings with the patient's family.  She will be 50% weightbearing, left lower extremity with a walker.  She will be readmitted to the hospital.  She worked with physical and occupational therapy.  We will place her on Lovenox for DVT prophylaxis.  I will see her in the office 2 weeks postoperatively.  All questions solicited and answered to her satisfaction.          ______________________________ Rod Can, MD     BS/MEDQ  D:  07/06/2015  T:  07/07/2015  Job:  [id number]

## 2015-07-07 NOTE — Evaluation (Signed)
Physical Therapy Evaluation Patient Details Name: Lugenia Macri MRN: [id number] DOB: 1945-09-16 Today's Date: 07/07/2015   History of Present Illness  69 yo female admitted after sustaining fall. S/P L femur ORIF 07/06/15. Hx of breast cancer with mets, HTN, unspecified arrythmia.   Clinical Impression  On eval, pt required Min assist for mobility-walked ~30 feet with RW. Pain rated 2/10 with activity. Educated pt on PWB status. Discussed d/c plan-plan is for ST rehab at SNF prior to returning home.     Follow Up Recommendations SNF    Equipment Recommendations  Rolling walker with 5" wheels (youth)    Recommendations for Other Services       Precautions / Restrictions Precautions Precautions: Fall Restrictions Weight Bearing Restrictions: Yes LLE Weight Bearing: Partial weight bearing LLE Partial Weight Bearing Percentage or Pounds: 50      Mobility  Bed Mobility Overal bed mobility: Needs Assistance Bed Mobility: Supine to Sit     Supine to sit: Min assist     General bed mobility comments: Assist for L LE. Increased time. VCs safety, technique.   Transfers Overall transfer level: Needs assistance Equipment used: Rolling walker (2 wheeled) Transfers: Sit to/from Stand Sit to Stand: Min assist         General transfer comment: Assist to rise, stabilize, control descent. VCs safety, technique, hand placement.   Ambulation/Gait Ambulation/Gait assistance: Min assist Ambulation Distance (Feet): 30 Feet Assistive device: Rolling walker (2 wheeled) Gait Pattern/deviations: Step-to pattern;Antalgic     General Gait Details: VCs safety, technique, sequence, adherence to PWB status. Slow gait speed. Assist to stabilize pt.   Stairs            Wheelchair Mobility    Modified Rankin (Stroke Patients Only)       Balance                                             Pertinent Vitals/Pain Pain Assessment: 0-10 Pain Score: 2  Pain  Location: L LE with activity. No pain at rest. Pain Descriptors / Indicators: Sore Pain Intervention(s): Limited activity within patient's tolerance;Monitored during session;Ice applied;Repositioned    Home Living Family/patient expects to be discharged to:: Skilled nursing facility Living Arrangements: Spouse/significant other   Type of Home: House Home Access: Stairs to enter   [employer] of Steps: 1 Home Layout: Multi-level;Able to live on main level with bedroom/bathroom (sofa bed on 1st level) Home Equipment: None      Prior Function Level of Independence: Independent               Hand Dominance        Extremity/Trunk Assessment   Upper Extremity Assessment: Defer to OT evaluation           Lower Extremity Assessment: LLE deficits/detail   LLE Deficits / Details: at least 2/5 throughout  Cervical / Trunk Assessment: Normal  Communication   Communication: No difficulties  Cognition Arousal/Alertness: Awake/alert Behavior During Therapy: WFL for tasks assessed/performed Overall Cognitive Status: Within Functional Limits for tasks assessed                      General Comments      Exercises General Exercises - Lower Extremity Ankle Circles/Pumps: AROM;Both;10 reps;Supine Quad Sets: AROM;Both;10 reps;Supine Heel Slides: AAROM;Left;10 reps;Supine Hip ABduction/ADduction: AAROM;Left;10 reps;Supine      Assessment/Plan  PT Assessment Patient needs continued PT services  PT Diagnosis Difficulty walking;Acute pain   PT Problem List Decreased strength;Decreased range of motion;Decreased activity tolerance;Decreased balance;Decreased mobility;Decreased knowledge of use of DME;Pain  PT Treatment Interventions DME instruction;Gait training;Functional mobility training;Therapeutic activities;Patient/family education;Balance training;Therapeutic exercise   PT Goals (Current goals can be found in the Care Plan section) Acute Rehab PT  Goals Patient Stated Goal: to regain independence PT Goal Formulation: With patient/family Time For Goal Achievement: 07/21/15 Potential to Achieve Goals: Good    Frequency Min 3X/week   Barriers to discharge        Co-evaluation               End of Session Equipment Utilized During Treatment: Gait belt Activity Tolerance: Patient tolerated treatment well Patient left: in chair;with call bell/phone within reach;with chair alarm set;with family/visitor present           Time: [id number] PT Time Calculation (min) (ACUTE ONLY): 36 min   Charges:   PT Evaluation $Initial PT Evaluation Tier I: 1 Procedure PT Treatments $Gait Training: 23-37 mins   PT G Codes:        Weston Anna, MPT Pager: [phone]

## 2015-07-07 NOTE — Progress Notes (Signed)
   Subjective:  Patient reports pain as mild to moderate.  No c/o. Up with therapy twice today.  Objective:   VITALS:   Filed Vitals:   07/06/15 2141 07/06/15 2317 07/07/15 0210 07/07/15 0635  BP: 126/72 115/45 100/50 131/56  Pulse: 72 73 71 70  Temp: 97.9 F (36.6 C)  98.6 F (37 C) 98.1 F (36.7 C)  TempSrc: Oral  Oral Oral  Resp: 14  16 18   Height:      Weight:      SpO2: 98%  98% 100%     ABD soft Sensation intact distally Intact pulses distally Dorsiflexion/Plantar flexion intact Incision: dressing C/D/I Compartment soft    Lab Results  Component Value Date   WBC 9.5 07/07/2015   HGB 11.9* 07/07/2015   HCT 34.2* 07/07/2015   MCV 92.4 07/07/2015   PLT 183 07/07/2015   BMET    Component Value Date/Time   NA 141 07/07/2015 0641   K 3.8 07/07/2015 0641   CL 108 07/07/2015 0641   CO2 27 07/07/2015 0641   GLUCOSE 189* 07/07/2015 0641   BUN 11 07/07/2015 0641   CREATININE 0.63 07/07/2015 0641   CALCIUM 7.9* 07/07/2015 0641   GFRNONAA >60 07/07/2015 0641   GFRAA >60 07/07/2015 0641     Assessment/Plan: 1 Day Post-Op   Principal Problem:   Femur fracture, left, closed, initial encounter Active Problems:   Breast cancer ([facility])   Femur fracture, left ([facility])   Hypertension   Ventricular bigeminy   Non-sustained ventricular tachycardia ([facility])   Closed left subtrochanteric femur fracture ([facility])   50% WB LLE with walker DVT ppx: lovenox x30 days, SCDs, TEDs PO pain control UTI per primary team Dispo: D/C planning   Angeligue Bowne, Horald Pollen 07/07/2015, 12:56 PM   Rod Can, MD Cell [phone]

## 2015-07-07 NOTE — Evaluation (Signed)
Occupational Therapy Evaluation Patient Details Name: [name] MRN: [id number] DOB: 08/22/1945 Today's Date: 07/07/2015    History of Present Illness 69 yo female admitted after sustaining fall. S/P L femur ORIF 07/06/15. Hx of breast cancer with mets, HTN, unspecified arrythmia.    Clinical Impression   Pt admitted with the above diagnosis and has the deficits listed below. Pt would benefit from cont OT to increase independence and safety with basic adls so husband will not be burdened with her care.  Pt should be safe to d/c home with husband after short rehab stay.    Follow Up Recommendations  SNF    Equipment Recommendations  3 in 1 bedside comode;Tub/shower seat    Recommendations for Other Services       Precautions / Restrictions Precautions Precautions: Fall Restrictions Weight Bearing Restrictions: Yes LLE Weight Bearing: Partial weight bearing LLE Partial Weight Bearing Percentage or Pounds: 50      Mobility Bed Mobility Overal bed mobility: Needs Assistance Bed Mobility: Supine to Sit     Supine to sit: Min assist     General bed mobility comments: Assist for L LE. Increased time. VCs safety, technique.   Transfers Overall transfer level: Needs assistance Equipment used: Rolling walker (2 wheeled) Transfers: Sit to/from Stand Sit to Stand: Min guard         General transfer comment: Vcs for hand placement.  Pt gets confused on where to place hands when coming to sit and then coming to standing position.    Balance Overall balance assessment: Needs assistance         Standing balance support: Bilateral upper extremity supported;During functional activity Standing balance-Leahy Scale: Poor Standing balance comment: Needs walker due to PWB status.                            ADL Overall ADL's : Needs assistance/impaired Eating/Feeding: Independent;Sitting   Grooming: Wash/dry face;Wash/dry hands;Oral care;Min  guard;Standing Grooming Details (indicate cue type and reason): stood at sink Upper Body Bathing: Set up;Sitting   Lower Body Bathing: Min guard;Sit to/from stand Lower Body Bathing Details (indicate cue type and reason): pt can reach both feet to bathe and needs min guard when standing to wash backside. Upper Body Dressing : Set up;Sitting   Lower Body Dressing: Minimal assistance;Sit to/from stand Lower Body Dressing Details (indicate cue type and reason): min assist to donn and doff L sock and shoe.  Min guard to stand to pull pants up and fasten. Toilet Transfer: Minimal assistance;Ambulation;Grab bars;Comfort height toilet;Cueing for safety Toilet Transfer Details (indicate cue type and reason): Pt walked to bathroom.  Cues for hand placement when sitting and standing. Toileting- Water quality scientist and Hygiene: Min guard;Sit to/from stand Toileting - Clothing Manipulation Details (indicate cue type and reason): min guard when standing for balance.     Functional mobility during ADLs: Min guard;Rolling walker;Cueing for sequencing General ADL Comments: Pt doing very well with adls.  Needs guarding when standing due to PWB status.  Pt appears to do well with PWB.      Vision Vision Assessment?: No apparent visual deficits   Perception     Praxis      Pertinent Vitals/Pain Pain Assessment: 0-10 Pain Score: 2  Pain Location: LLE Pain Descriptors / Indicators: Sore Pain Intervention(s): Limited activity within patient's tolerance;Repositioned;Monitored during session     Hand Dominance Right   Extremity/Trunk Assessment Upper Extremity Assessment Upper Extremity Assessment: Overall [facility]  for tasks assessed   Lower Extremity Assessment Lower Extremity Assessment: Defer to PT evaluation LLE Deficits / Details: at least 2/5 throughout LLE: Unable to fully assess due to pain   Cervical / Trunk Assessment Cervical / Trunk Assessment: Normal   Communication  Communication Communication: No difficulties   Cognition Arousal/Alertness: Awake/alert Behavior During Therapy: WFL for tasks assessed/performed Overall Cognitive Status: Within Functional Limits for tasks assessed                     General Comments       Exercises       Shoulder Instructions      Home Living Family/patient expects to be discharged to:: Skilled nursing facility Living Arrangements: Spouse/significant other   Type of Home: House Home Access: Stairs to enter [employer] of Steps: 1   Home Layout: Multi-level;Able to live on main level with bedroom/bathroom Alternate Level Stairs-Number of Steps: 14 Alternate Level Stairs-Rails: Left           Home Equipment: None          Prior Functioning/Environment Level of Independence: Independent             OT Diagnosis: Generalized weakness   OT Problem List: Decreased strength;Decreased knowledge of use of DME or AE;Impaired balance (sitting and/or standing)   OT Treatment/Interventions: Self-care/ADL training;DME and/or AE instruction;Therapeutic activities    OT Goals(Current goals can be found in the care plan section) Acute Rehab OT Goals Patient Stated Goal: to regain independence OT Goal Formulation: With patient/family Time For Goal Achievement: 07/21/15 Potential to Achieve Goals: Good ADL Goals Pt Will Perform Lower Body Dressing: with supervision;sit to/from stand Pt Will Perform Tub/Shower Transfer: Shower transfer;with supervision;shower seat;ambulating;rolling walker Additional ADL Goal #1: Pt will walk to bathroom with walker and toilet with 3:1 over commode with S. Additional ADL Goal #2: Pt will bathe in shower with seat and S.  OT Frequency: Min 2X/week   Barriers to D/C: Decreased caregiver support  only husband at home to care for pt.        Co-evaluation              End of Session Equipment Utilized During Treatment: Rolling walker Nurse  Communication: Mobility status  Activity Tolerance: Patient tolerated treatment well Patient left: in bed;with call bell/phone within reach;with family/visitor present   Time: [id number] OT Time Calculation (min): 30 min Charges:  OT General Charges $OT Visit: 1 Procedure OT Evaluation $Initial OT Evaluation Tier I: 1 Procedure OT Treatments $Self Care/Home Management : 8-22 mins G-Codes:    Glenford Peers 2015/07/13, 11:26 AM  [phone]

## 2015-07-07 NOTE — Progress Notes (Signed)
[facility]                                                                              Patient Demographics  [name], is a 69 y.o. female, DOB - April 27, 1946, [id number]  Admit date - 07/05/2015   Admitting Physician Reubin Milan, MD  Outpatient Primary MD for the patient is No primary care provider on file.  LOS - 2   Chief Complaint  Patient presents with  . Fall  . Hip Injury      HPI on 07/05/2015 by Dr. Gerri Lins Nino Glow [name] is a 69 y.o. female with a past medical history of breast cancer, hypertension, unspecified arrhythmia (which the patient stated was due to anxiety) who was brought to the emergency department via EMS after she fell at a grocery store while she was walking on a slippery floor. Per patient, she fell on her left side, had immediate intense pain, noticed a deformity in her thigh and was unable to get up on her own. She states that she did not lose consciousness or have head trauma during the fall. She denies having pre-fall chest pain, palpitations, dizziness, dyspnea, diaphoresis, nausea or emesis. She states that this was purely an accident.  During transportation to the emergency department, the patient was given intranasal fentanyl which helped relieve her pain. EMS described 7 beat nonsustained ventricular tachycardia run, with other smaller runs noticed on telemetry in the emergency department alternating with ventricular bigeminy. The patient is taking metoprolol 25 mg by mouth twice a day, but had not taken the last 2 scheduled doses prior to her accident. She has a previous history of tachyarrhythmia, but workup done several years ago apparently did not show any CAD.   When seen in the emergency department, the patient was in no acute distress. She is states that she was in no pain, as long as she did not move her left lower extremity.  Assessment & Plan   Left femur fracture -Secondary to fall -Left pelvic xray: Displaced and  angulated fracture of the left proximal femoral diaphysis -Orthopedic surgery consulted and appreciated, s/p IM nail  -Continue pain control -PT and OT consulted for evaluation   Ventricular bigeminy/nonsustained ventricular tachycardia -Secondary to missed doses of metoprolol -Continue home regimen -Potassium 3.8, magnesium 1.8 -Patient follows with a cardiologist in [place], [place] -Echocardiogram: EF [id number], Grade 1 diastolic dysfunction  Metastatic breast cancer -Continue Aromasin and Xgeva injections -Patient follows up with oncology at [place] [facility] -Continue metoprolol and lisinopril  Code Status: Full  Family Communication: None at bedside  Disposition Plan: Admitted. Pending PT/OT  Time Spent in minutes   30 minutes  Procedures  Left IM nail  Consults   Orthopedic surgery  DVT Prophylaxis  Lovenox  Lab Results  Component Value Date   PLT 183 07/07/2015    Medications  Scheduled Meds: . acidophilus  1 capsule Oral Daily  . calcium-vitamin D   Oral BID  . ciprofloxacin  500 mg Oral BID  . docusate sodium  100 mg Oral BID  . enoxaparin (LOVENOX) injection  40 mg Subcutaneous Q24H  . exemestane  25 mg Oral Daily  .  lisinopril  20 mg Oral Daily  . metoprolol tartrate  25 mg Oral BID  . pantoprazole  40 mg Oral Daily  . senna  1 tablet Oral BID   Continuous Infusions: . 0.9 % NaCl with KCl 20 mEq / L 75 mL/hr at 07/06/15 1500   PRN Meds:.acetaminophen **OR** acetaminophen, bismuth subsalicylate, menthol-cetylpyridinium **OR** phenol, metoCLOPramide **OR** metoCLOPramide (REGLAN) injection, morphine injection, ondansetron **OR** ondansetron (ZOFRAN) IV, oxyCODONE  Antibiotics    Anti-infectives    Start     Dose/Rate Route Frequency Ordered Stop   07/07/15 2000  ciprofloxacin (CIPRO) tablet 500 mg     500 mg Oral 2 times daily 07/07/15 0813 07/12/15 1959   07/07/15 0100  ceFAZolin (ANCEF) IVPB 2 g/50 mL premix     2 g 100 mL/hr over 30  Minutes Intravenous Every 6 hours 07/06/15 2146 07/07/15 0718   07/06/15 1600  ceFAZolin (ANCEF) IVPB 2 g/50 mL premix     2 g 100 mL/hr over 30 Minutes Intravenous To Surgery 07/06/15 [id number] 07/06/15 1848      Subjective:   [name] seen and examined today.  Patient states she is doing much better today and is ready to get out of bed.  She denies chest pain, palpitations, dizziness, headache, shortness of breath, abdominal pain, nausea or vomiting, diarrhea or constipation.   Objective:   Filed Vitals:   07/06/15 2141 07/06/15 2317 07/07/15 0210 07/07/15 0635  BP: 126/72 115/45 100/50 131/56  Pulse: 72 73 71 70  Temp: 97.9 F (36.6 C)  98.6 F (37 C) 98.1 F (36.7 C)  TempSrc: Oral  Oral Oral  Resp: 14  16 18   Height:      Weight:      SpO2: 98%  98% 100%    Wt Readings from Last 3 Encounters:  07/05/15 56.5 kg (124 lb 9 oz)     Intake/Output Summary (Last 24 hours) at 07/07/15 1010 Last data filed at 07/07/15 0900  Gross per 24 hour  Intake 3516.25 ml  Output   3110 ml  Net 406.25 ml    Exam  General: Well developed, well nourished, NAD  HEENT: NCAT, mucous membranes moist.   Cardiovascular: S1 S2 auscultated, RRR  Respiratory: Clear to auscultation bilaterally  Abdomen: Soft, nontender, nondistended, + bowel sounds  Extremities: warm dry without cyanosis clubbing or edema. Bandage noted on LLE  Neuro: AAOx3, Nonfocal  Psych: Normal affect and demeanor, pleasant  Data Review   Micro Results Recent Results (from the past 240 hour(s))  Surgical pcr screen     Status: Abnormal   Collection Time: 07/06/15 12:46 PM  Result Value Ref Range Status   MRSA, PCR NEGATIVE NEGATIVE Final   Staphylococcus aureus POSITIVE (A) NEGATIVE Final    Comment:        The Xpert SA Assay (FDA approved for NASAL specimens in patients over 53 years of age), is one component of a comprehensive surveillance program.  Test performance has been validated by [facility]  for patients greater than or equal to 19 year old. It is not intended to diagnose infection nor to guide or monitor treatment.     Radiology Reports Dg Chest 1 View  07/05/2015  CLINICAL DATA:  Preop for hip fracture. EXAM: CHEST 1 VIEW COMPARISON:  None. FINDINGS: Cardiomegaly with negative aortic and hilar contours. There is no edema, consolidation, effusion, or pneumothorax. Left axillary dissection. No acute osseous findings at the chest. IMPRESSION: 1. No active disease. 2. Mild cardiomegaly.  Electronically Signed   By: Monte Fantasia M.D.   On: 07/05/2015 17:20   Ct Femur Left W Contrast  07/06/2015  CLINICAL DATA:  History of breast cancer with bone metastases. Status post fall on a slippery floor 07/05/2015 with a left femur fracture. EXAM: CT OF THE LEFT FEMUR WITH CONTRAST TECHNIQUE: Contiguous axial CT images were taken through left femur after the administration of IV contrast material coronal and sagittal reconstructions are provided. CONTRAST:  100 mL OMNIPAQUE IOHEXOL 300 MG/ML  SOLN COMPARISON:  None. FINDINGS: As seen on the comparison plain films, there is a transverse fracture through the junction of the middle and distal thirds of the left femur. The distal fracture fragment demonstrates 1 shaft width posterior and lateral displacement. There is foreshortening of approximately 6.5 cm. No sclerotic or lytic bone lesion is seen about the fracture fragments. Multiple small sclerotic lesions are identified in the imaged pelvis and right hip consistent with metastatic disease. Soft tissues demonstrate hematoma in association with the patient's fracture. No soft tissue mass is identified. Imaged intrapelvic contents show some sigmoid diverticulosis without diverticulitis. IMPRESSION: Transverse diaphyseal fracture through the junction of the middle and distal thirds of the left femur as described above without findings to suggest pathologic injury. Multiple small sclerotic lesions are  seen in the imaged pelvis and femoral head and neck consistent with metastatic breast carcinoma. Sigmoid diverticulosis. Electronically Signed   By: Inge Rise M.D.   On: 07/06/2015 11:01   Pelvis Portable  07/06/2015  CLINICAL DATA:  Status post left femur fracture repair EXAM: PORTABLE PELVIS 1-2 VIEWS COMPARISON:  None. FINDINGS: Interval placement of a left intra medullary nail with an interlocking femoral neck screw transfixing a proximal left femoral diaphysis fracture in near anatomic alignment on a AP view. Surrounding postsurgical changes in the surrounding soft tissues. Sclerotic lesions are noted throughout the pelvis and bilateral femurs consistent with IMPRESSION: ORIF proximal left femoral diaphysis fracture. Multiple sclerotic osseous lesions again noted in the pelvis and proximal femurs consistent with metastatic disease. Electronically Signed   By: Kathreen Devoid   On: 07/06/2015 21:34   Dg C-arm 1-60 Min-no Report  07/06/2015  CLINICAL DATA: surgery C-ARM 1-60 MINUTES Fluoroscopy was utilized by the requesting physician.  No radiographic interpretation.   Dg Hip Unilat With Pelvis 2-3 Views Left  07/05/2015  CLINICAL DATA:  Status post fall. EXAM: DG HIP (WITH OR WITHOUT PELVIS) 2-3V LEFT COMPARISON:  None. FINDINGS: There is no left hip fracture or dislocation. There is a displaced and angulated fracture of the left proximal femoral diaphysis with 15 mm of lateral and 3 cm of posterior displacement and apex lateral angulation. There are sclerotic lesions scattered throughout the pelvis and proximal bilateral femurs most concerning for metastatic disease. IMPRESSION: 1. Displaced and angulated fracture of the left proximal femoral diaphysis . 2. 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 Electronically Signed   By: Kathreen Devoid   On: 07/05/2015 17:24   Dg Femur Min 2 Views Left  07/06/2015  CLINICAL DATA:  Left femur  fracture.  Adamantinoma. EXAM: LEFT FEMUR 2 VIEWS; DG C-ARM 1-60 MIN-NO REPORT COMPARISON:  None. FINDINGS: The patient is status post ORIF with placement of an intra medullary rod. The fracture is aligned on the AP view with less than 1 width of the cortex displacement remaining on the lateral image. A dynamic hip screws in place. A single distal interlocking screw is noted. The knee and  hip are located. IMPRESSION: Status post ORIF of the proximal left femur fracture without radiographic evidence for complication. Electronically Signed   By: San Morelle M.D.   On: 07/06/2015 20:24   Dg Femur Min 2 Views Left  07/05/2015  CLINICAL DATA:  69 year old female with history of trauma from a fall complaining of left upper leg pain. EXAM: LEFT FEMUR 2 VIEWS COMPARISON:  No priors. FINDINGS: Two nonstandard views of the left femur are submitted for evaluation. There is a displaced foreshortened fracture in the proximal third of the left femoral diaphysis, with approximately 1 shaft width of posterior displacement and at least 3.9 cm of proximal migration of the distal femoral fragment, and approximately 30 degrees of apex anterolateral angulation. Femoral head appears located. IMPRESSION: 1. Acute displaced foreshortened and angulated fracture of the proximal third of the left femoral diaphysis, as above. Electronically Signed   By: Vinnie Langton M.D.   On: 07/05/2015 17:22   Dg Femur Port Min 2 Views Left  07/06/2015  CLINICAL DATA:  69 year old female status post intra medullary rod fixation rod placement in the left femur EXAM: LEFT FEMUR PORTABLE 2 VIEWS COMPARISON:  Intraoperative radiograph dated 07/06/2015 FINDINGS: Status post open reduction and internal fixation with placement of and intra medullary rod traversing the fracture of the proximal diaphysis of the left femur. There is approximately 1-2 mm cortical displacement at the fracture site on the frontal projection and 3-4 mm cortical  displacement on the cross-table lateral projection. A fixation screw traverses the left femoral neck. Soft tissue gas noted in the lateral aspect of the proximal thigh compatible with postsurgical changes. IMPRESSION: Internal medullary rod traversing left femoral fracture as described. Electronically Signed   By: Anner Crete M.D.   On: 07/06/2015 21:46    CBC  Recent Labs Lab 07/05/15 1732 07/06/15 0501 07/07/15 0500  WBC 9.2 8.9 9.5  HGB 12.7 12.6 11.9*  HCT 37.4 37.3 34.2*  PLT 220 248 183  MCV 94.4 95.2 92.4  MCH 32.1 32.1 32.2  MCHC 34.0 33.8 34.8  RDW 12.1 12.2 12.2  LYMPHSABS 1.5  --   --   MONOABS 0.3  --   --   EOSABS 0.0  --   --   BASOSABS 0.0  --   --     Chemistries   Recent Labs Lab 07/05/15 1732 07/05/15 1832 07/06/15 0501 07/07/15 0641  NA 142  --  138 141  K 3.5  --  3.8 3.8  CL 108  --  108 108  CO2 27  --  22 27  GLUCOSE 132*  --  103* 189*  BUN 13  --  14 11  CREATININE 0.60  --  0.64 0.63  CALCIUM 8.9  --  8.3* 7.9*  MG  --  1.8  --   --   AST  --   --  20  --   ALT  --   --  9*  --   ALKPHOS  --   --  45  --   BILITOT  --   --  0.8  --    ------------------------------------------------------------------------------------------------------------------ estimated creatinine clearance is 50.8 mL/min (by C-G formula based on Cr of 0.63). ------------------------------------------------------------------------------------------------------------------ No results for input(s): HGBA1C in the last 72 hours. ------------------------------------------------------------------------------------------------------------------ No results for input(s): CHOL, HDL, LDLCALC, TRIG, CHOLHDL, LDLDIRECT in the last 72 hours. ------------------------------------------------------------------------------------------------------------------ No results for input(s): TSH, T4TOTAL, T3FREE, THYROIDAB in the last 72 hours.  Invalid input(s):  FREET3 ------------------------------------------------------------------------------------------------------------------ No  results for input(s): VITAMINB12, FOLATE, FERRITIN, TIBC, IRON, RETICCTPCT in the last 72 hours.  Coagulation profile  Recent Labs Lab 07/05/15 1732  INR 1.03    No results for input(s): DDIMER in the last 72 hours.  Cardiac Enzymes  Recent Labs Lab 07/05/15 1732 07/05/15 2310 07/06/15 0501  TROPONINI <0.03 <0.03 0.03   ------------------------------------------------------------------------------------------------------------------ Invalid input(s): POCBNP    Arabelle Bollig D.O. on 07/07/2015 at 10:10 AM  Between 7am to 7pm - Pager - [phone]  After 7pm go to [email or web address] - password [email or web address]  And look for the night coverage person covering for me after hours  [facility] Office  [phone]

## 2015-07-08 DIAGNOSIS — I498 Other specified cardiac arrhythmias: Secondary | ICD-10-CM

## 2015-07-08 DIAGNOSIS — D62 Acute posthemorrhagic anemia: Secondary | ICD-10-CM | POA: Insufficient documentation

## 2015-07-08 DIAGNOSIS — S7290XA Unspecified fracture of unspecified femur, initial encounter for closed fracture: Secondary | ICD-10-CM

## 2015-07-08 DIAGNOSIS — S7223XA Displaced subtrochanteric fracture of unspecified femur, initial encounter for closed fracture: Secondary | ICD-10-CM

## 2015-07-08 DIAGNOSIS — N390 Urinary tract infection, site not specified: Secondary | ICD-10-CM

## 2015-07-08 LAB — CBC
HCT: 28.7 % — ABNORMAL LOW (ref 36.0–46.0)
HEMOGLOBIN: 9.8 g/dL — AB (ref 12.0–15.0)
MCH: 32.3 pg (ref 26.0–34.0)
MCHC: 34.1 g/dL (ref 30.0–36.0)
MCV: 94.7 fL (ref 78.0–100.0)
PLATELETS: 199 10*3/uL (ref 150–400)
RBC: 3.03 MIL/uL — AB (ref 3.87–5.11)
RDW: 12.6 % (ref 11.5–15.5)
WBC: 9.6 10*3/uL (ref 4.0–10.5)

## 2015-07-08 LAB — BASIC METABOLIC PANEL
ANION GAP: 4 — AB (ref 5–15)
BUN: 14 mg/dL (ref 6–20)
CALCIUM: 8.4 mg/dL — AB (ref 8.9–10.3)
CO2: 29 mmol/L (ref 22–32)
Chloride: 109 mmol/L (ref 101–111)
Creatinine, Ser: 0.67 mg/dL (ref 0.44–1.00)
GLUCOSE: 121 mg/dL — AB (ref 65–99)
POTASSIUM: 4 mmol/L (ref 3.5–5.1)
Sodium: 142 mmol/L (ref 135–145)

## 2015-07-08 MED ORDER — OXYCODONE-ACETAMINOPHEN 5-325 MG PO TABS: 1.0000 | ORAL_TABLET | ORAL | Status: DC | PRN

## 2015-07-08 MED ORDER — ENOXAPARIN SODIUM 40 MG/0.4ML ~~LOC~~ SOLN: 40.0000 mg | SUBCUTANEOUS | Status: DC

## 2015-07-08 MED ORDER — BISACODYL 10 MG RE SUPP
10.0000 mg | Freq: Once | RECTAL | Status: AC
  Administered 2015-07-08: 10 mg via RECTAL
  Filled 2015-07-08: qty 1

## 2015-07-08 MED ORDER — CIPROFLOXACIN HCL 500 MG PO TABS: 500.0000 mg | ORAL_TABLET | Freq: Two times a day (BID) | ORAL | Status: DC

## 2015-07-08 MED ORDER — SENNA 8.6 MG PO TABS: 1.0000 | ORAL_TABLET | Freq: Two times a day (BID) | ORAL | Status: DC

## 2015-07-08 NOTE — Progress Notes (Signed)
Patient had an episode of emesis. Mostly food. Patient stated that she felt so much better after vomiting. Patient had stated that she felt full and had eaten too much lunch prior to getting sick.   [name] [facility] 07/08/2015

## 2015-07-08 NOTE — Clinical Social Work Note (Signed)
Clinical Social Work Assessment  Patient Details  Name: [name] MRN: [id number] Date of Birth: [birth year]  Date of referral:  07/07/15               Reason for consult:  Facility Placement                Permission sought to share information with:  Family Supports Permission granted to share information::  Yes, Verbal Permission Granted  Name::      (husband)  Agency::     Relationship::     Contact Information:     Housing/Transportation Living arrangements for the past 2 months:  Single Family Home Source of Information:  Patient, Adult Children Patient Interpreter Needed:  None Criminal Activity/Legal Involvement Pertinent to Current Situation/Hospitalization:  No - Comment as needed Significant Relationships:  Adult Children, Spouse Lives with:  Spouse Do you feel safe going back to the place where you live?  No Need for family participation in patient care:  Yes (Comment)  Care giving concerns:  Patient will need SNF for rehab   Social Worker assessment / plan:  CSW met with patient and her husband to discuss SNF- patient was in [place] shopping (from [place], [place] where they live) when she fell- they understand the recommendation for SNF and agree to pursue this-   Employment status:  Retired Forensic scientist:  Medicare PT Recommendations:  [place] / Referral to community resources:  [place]  Patient/Family's Response to care: agreeable   Patient/Family's Understanding of and Emotional Response to Diagnosis, Current Treatment, and Prognosis:  Positive and eager to rehab and get home   Emotional Assessment Appearance:  Developmentally appropriate Attitude/Demeanor/Rapport:    Affect (typically observed):  Accepting, Appropriate Orientation:  Oriented to Self, Oriented to Place, Oriented to  Time, Oriented to Situation Alcohol / Substance use:  Not Applicable Psych involvement (Current and /or in the  community):  No (Comment)  Discharge Needs  Concerns to be addressed:  Discharge Planning Concerns Readmission within the last 30 days:  No Current discharge risk:  None Barriers to Discharge:  No Barriers Identified   Ludwig Clarks, LCSW 07/08/2015, 2:10 PM

## 2015-07-08 NOTE — Progress Notes (Signed)
Report called to Ginger at [facility]. Patient is stable for transport. Awaiting to see if patient will travel by family in their car or by ambulance. Patient is requesting ambulance. Social worker is following up on this and will let RN know.   Othella Boyer [facility] 07/08/2015 2:54 PM

## 2015-07-08 NOTE — Clinical Social Work Placement (Signed)
   CLINICAL SOCIAL WORK PLACEMENT  NOTE  Date:  07/08/2015  Patient Details  Name: [name] MRN: [id number] Date of Birth: Sep 28, 1945  Clinical Social Work is seeking post-discharge placement for this patient at the [place] level of care (*CSW will initial, date and re-position this form in  chart as items are completed):  Yes   Patient/family provided with [facility] Work Department's list of facilities offering this level of care within the geographic area requested by the patient (or if unable, by the patient's family).  Yes   Patient/family informed of their freedom to choose among providers that offer the needed level of care, that participate in Medicare, Medicaid or managed care program needed by the patient, have an available bed and are willing to accept the patient.  Yes   Patient/family informed of [facility]'s ownership interest in [facility] and [facility], as well as of the fact that they are under no obligation to receive care at these facilities.  PASRR submitted to EDS on       PASRR number received on       Existing PASRR number confirmed on       FL2 transmitted to all facilities in geographic area requested by pt/family on 07/07/15     FL2 transmitted to all facilities within larger geographic area on 07/08/15     Patient informed that his/her managed care company has contracts with or will negotiate with certain facilities, including the following:        Yes   Patient/family informed of bed offers received.  Patient chooses bed at       Physician recommends and patient chooses bed at      Patient to be transferred to  [facility]) on  .  Patient to be transferred to facility by husbands car     Patient family notified on 07/08/15 of transfer.  Name of family member notified:  husband      PHYSICIAN       Additional Comment:    _______________________________________________ Ludwig Clarks, LCSW 07/08/2015, 1:34 PM

## 2015-07-08 NOTE — Progress Notes (Signed)
Physical Therapy Treatment Patient Details Name: Delories Ziemer MRN: [id number] DOB: 09-Dec-1945 Today's Date: 07/08/2015    History of Present Illness 69 yo female admitted after sustaining fall. S/P L femur ORIF 07/06/15. Hx of breast cancer with mets, HTN, unspecified arrythmia.     PT Comments    Progressing well with mobility. Pain rated 3-4/10 with activity.   Follow Up Recommendations  SNF     Equipment Recommendations  Rolling walker with 5" wheels (youth)    Recommendations for Other Services       Precautions / Restrictions Precautions Precautions: Fall Restrictions Weight Bearing Restrictions: Yes LLE Weight Bearing: Partial weight bearing LLE Partial Weight Bearing Percentage or Pounds: 50    Mobility  Bed Mobility Overal bed mobility: Needs Assistance Bed Mobility: Supine to Sit;Sit to Supine     Supine to sit: Min assist Sit to supine: Min assist   General bed mobility comments: Assist for L LE. Increased time. VCs safety, technique.   Transfers Overall transfer level: Needs assistance Equipment used: Rolling walker (2 wheeled) Transfers: Sit to/from Stand Sit to Stand: Min guard         General transfer comment: VCs hand placement  Ambulation/Gait Ambulation/Gait assistance: Min guard Ambulation Distance (Feet): 60 Feet Assistive device: Rolling walker (2 wheeled) Gait Pattern/deviations: Step-to pattern;Antalgic     General Gait Details: VCs safety, technique, sequence, adherence to PWB status. Slow gait speed. close guard for safety   Stairs            Wheelchair Mobility    Modified Rankin (Stroke Patients Only)       Balance                                    Cognition Arousal/Alertness: Awake/alert Behavior During Therapy: WFL for tasks assessed/performed Overall Cognitive Status: Within Functional Limits for tasks assessed                      Exercises General Exercises - Lower  Extremity Ankle Circles/Pumps: AROM;Both;10 reps;Supine Quad Sets: AROM;Both;10 reps;Supine Heel Slides: AAROM;Left;10 reps;Supine Hip ABduction/ADduction: AAROM;Left;10 reps;Supine    General Comments        Pertinent Vitals/Pain Pain Assessment: 0-10 Pain Score: 3  Pain Location: L LE Pain Descriptors / Indicators: Sore Pain Intervention(s): Monitored during session;Ice applied;Repositioned    Home Living                      Prior Function            PT Goals (current goals can now be found in the care plan section) Progress towards PT goals: Progressing toward goals    Frequency  Min 3X/week    PT Plan Current plan remains appropriate    Co-evaluation             End of Session Equipment Utilized During Treatment: Gait belt Activity Tolerance: Patient tolerated treatment well Patient left: in bed;with call bell/phone within reach;with family/visitor present     Time: [id number] PT Time Calculation (min) (ACUTE ONLY): 15 min  Charges:  $Gait Training: 8-22 mins                    G Codes:      Weston Anna, MPT Pager: [phone]

## 2015-07-08 NOTE — Progress Notes (Signed)
   Subjective:  Patient reports pain as mild to moderate.  No c/o.  Objective:   VITALS:   Filed Vitals:   07/07/15 0635 07/07/15 1416 07/07/15 2110 07/08/15 0433  BP: 131/56 101/48 119/50 117/69  Pulse: 70 89 89 84  Temp: 98.1 F (36.7 C) 98.3 F (36.8 C) 97.9 F (36.6 C) 99.2 F (37.3 C)  TempSrc: Oral Oral Oral Oral  Resp: 18 20 18 18   Height:      Weight:      SpO2: 100% 99% 96% 95%     ABD soft Sensation intact distally Intact pulses distally Dorsiflexion/Plantar flexion intact Incision: dressing C/D/I Compartment soft    Lab Results  Component Value Date   WBC 9.6 07/08/2015   HGB 9.8* 07/08/2015   HCT 28.7* 07/08/2015   MCV 94.7 07/08/2015   PLT 199 07/08/2015   BMET    Component Value Date/Time   NA 142 07/08/2015 0536   K 4.0 07/08/2015 0536   CL 109 07/08/2015 0536   CO2 29 07/08/2015 0536   GLUCOSE 121* 07/08/2015 0536   BUN 14 07/08/2015 0536   CREATININE 0.67 07/08/2015 0536   CALCIUM 8.4* 07/08/2015 0536   GFRNONAA >60 07/08/2015 0536   GFRAA >60 07/08/2015 0536     Assessment/Plan: 2 Days Post-Op   Principal Problem:   Femur fracture, left, closed, initial encounter Active Problems:   Breast cancer ([place])   Femur fracture, left (HCC)   Hypertension   Ventricular bigeminy   Non-sustained ventricular tachycardia (HCC)   Closed left subtrochanteric femur fracture (HCC)   50% WB LLE with walker DVT ppx: lovenox x30 days, SCDs, TEDs PO pain control UTI per primary team Dispo: D/C planning   Tammara Massing, Horald Pollen 07/08/2015, 7:25 AM   Rod Can, MD Cell [phone]

## 2015-07-08 NOTE — Progress Notes (Signed)
Patient for d/c today to SNF bed at [facility] in [place]- Husband and patient agreeable to this plan- plan transfer via EMS. Eduard Clos, MSW, [place]

## 2015-07-08 NOTE — Discharge Summary (Addendum)
Physician Discharge Summary  [name] [id number] DOB: 22-Aug-1945 DOA: 07/05/2015  PCP: No primary care provider on file.  Admit date: 07/05/2015 Discharge date: 07/08/2015  Time spent: 45 minutes  Recommendations for Outpatient Follow-up:  Patient will be discharged to skilled nursing facility in [place], [place].  Continue physical as well as occupational health is recommended by the facility..  Patient will need to follow up with primary care provider within one week of discharge, repeat CBC and BMP.  Follow up with orthopedics, Dr. Lyla Glassing, in 2 weeks. Patient should continue medications as prescribed.  Patient should follow a heart healthy diet.   Discharge Diagnoses:  Left femur fracture Ventricular bigeminy/nonsustained ventricular tachycardia Metastatic breast cancer Essential hypertension  Discharge Condition: Stable  Diet recommendation: Heart healthy  Filed Weights   07/05/15 1958  Weight: 56.5 kg (124 lb 9 oz)    History of present illness:  on 07/05/2015 by Dr. Gerri Lins [name] is a 69 y.o. female with a past medical history of breast cancer, hypertension, unspecified arrhythmia (which the patient stated was due to anxiety) who was brought to the emergency department via EMS after she fell at a grocery store while she was walking on a slippery floor. Per patient, she fell on her left side, had immediate intense pain, noticed a deformity in her thigh and was unable to get up on her own. She states that she did not lose consciousness or have head trauma during the fall. She denies having pre-fall chest pain, palpitations, dizziness, dyspnea, diaphoresis, nausea or emesis. She states that this was purely an accident.  During transportation to the emergency department, the patient was given intranasal fentanyl which helped relieve her pain. EMS described 7 beat nonsustained ventricular tachycardia run, with other smaller runs noticed on telemetry in the  emergency department alternating with ventricular bigeminy. The patient is taking metoprolol 25 mg by mouth twice a day, but had not taken the last 2 scheduled doses prior to her accident. She has a previous history of tachyarrhythmia, but workup done several years ago apparently did not show any CAD.   When seen in the emergency department, the patient was in no acute distress. She is states that she was in no pain, as long as she did not move her left lower extremity.  Hospital Course:  Left femur fracture -Secondary to fall -Left pelvic xray: Displaced and angulated fracture of the left proximal femoral diaphysis -Orthopedic surgery consulted and appreciated, s/p IM nail  -Continue pain control -PT and OT consulted for evaluation and recommended SNF -Follow-up with orthopedics in 2 weeks, recommended Lovenox for 1 month  Ventricular bigeminy/nonsustained ventricular tachycardia -Secondary to missed doses of metoprolol -Continue home regimen -Potassium 4, magnesium 1.8 -Patient follows with a cardiologist in [place], [place] -Echocardiogram: EF [id number], Grade 1 diastolic dysfunction  Metastatic breast cancer -Continue Aromasin and Xgeva injections -Patient follows up with oncology at [facility] hypertension -Continue metoprolol and lisinopril'  Urinary tract infection  -UA showed many bacteria, 6-30 bacteria, small leukocytes, and positive nitrites  -Continue cipro -unfortunately, no Urine culture taken at admission  Acute blood loss anemia -Hb 9.8, likely secondary to surgery -Repeat CBC in one week.  Procedures  Left IM nail  Consults  Orthopedic surgery  Discharge Exam: Filed Vitals:   07/07/15 2110 07/08/15 0433  BP: 119/50 117/69  Pulse: 89 84  Temp: 97.9 F (36.6 C) 99.2 F (37.3 C)  Resp: 18 18   Exam  General: Well  developed, well nourished, NAD  HEENT: NCAT, mucous membranes moist.   Cardiovascular: S1 S2 auscultated, RRR  Respiratory: Clear  to auscultation bilaterally  Abdomen: Soft, nontender, nondistended, + bowel sounds  Extremities: warm dry without cyanosis clubbing or edema  Neuro: AAOx3, Nonfocal  Psych: Normal affect and demeanor, pleasant  Discharge Instructions     Medication List    STOP taking these medications        naproxen sodium 220 MG tablet  Commonly known as:  ANAPROX      TAKE these medications        enoxaparin 40 MG/0.4ML injection  Commonly known as:  LOVENOX  Inject 0.4 mLs (40 mg total) into the skin daily.     oxyCODONE-acetaminophen 5-325 MG tablet  Commonly known as:  ROXICET  Take 1-2 tablets by mouth every 4 (four) hours as needed for severe pain.      ASK your doctor about these medications        bismuth subsalicylate [id number] suspension  Commonly known as:  PEPTO BISMOL  Take 30 mLs by mouth every 6 (six) hours as needed for indigestion.     CALCIUM 600 + D PO  Take 1 tablet by mouth 2 (two) times daily.     exemestane 25 MG tablet  Commonly known as:  AROMASIN  Take 25 mg by mouth daily.     lisinopril 20 MG tablet  Commonly known as:  PRINIVIL,ZESTRIL  Take 20 mg by mouth daily.     metoprolol tartrate 25 MG tablet  Commonly known as:  LOPRESSOR  Take 25 mg by mouth 2 (two) times daily.     PROBIOTIC PO  Take 1 capsule by mouth daily.       Allergies  Allergen Reactions  . Keflex [Cephalexin] Diarrhea       Follow-up Information    Follow up with Swinteck, Horald Pollen, MD In 2 weeks.   Specialty:  Orthopedic Surgery   Why:  For wound re-check   Contact information:   [address] [place] [address] [phone]       Schedule an appointment as soon as possible for a visit in 1 week to follow up.       The results of significant diagnostics from this hospitalization (including imaging, microbiology, ancillary and laboratory) are listed below for reference.    Significant Diagnostic Studies: Dg Chest 1  View  07/05/2015  CLINICAL DATA:  Preop for hip fracture. EXAM: CHEST 1 VIEW COMPARISON:  None. FINDINGS: Cardiomegaly with negative aortic and hilar contours. There is no edema, consolidation, effusion, or pneumothorax. Left axillary dissection. No acute osseous findings at the chest. IMPRESSION: 1. No active disease. 2. Mild cardiomegaly. Electronically Signed   By: Monte Fantasia M.D.   On: 07/05/2015 17:20   Ct Femur Left W Contrast  07/06/2015  CLINICAL DATA:  History of breast cancer with bone metastases. Status post fall on a slippery floor 07/05/2015 with a left femur fracture. EXAM: CT OF THE LEFT FEMUR WITH CONTRAST TECHNIQUE: Contiguous axial CT images were taken through left femur after the administration of IV contrast material coronal and sagittal reconstructions are provided. CONTRAST:  100 mL OMNIPAQUE IOHEXOL 300 MG/ML  SOLN COMPARISON:  None. FINDINGS: As seen on the comparison plain films, there is a transverse fracture through the junction of the middle and distal thirds of the left femur. The distal fracture fragment demonstrates 1 shaft width posterior and lateral displacement. There is foreshortening of  approximately 6.5 cm. No sclerotic or lytic bone lesion is seen about the fracture fragments. Multiple small sclerotic lesions are identified in the imaged pelvis and right hip consistent with metastatic disease. Soft tissues demonstrate hematoma in association with the patient's fracture. No soft tissue mass is identified. Imaged intrapelvic contents show some sigmoid diverticulosis without diverticulitis. IMPRESSION: Transverse diaphyseal fracture through the junction of the middle and distal thirds of the left femur as described above without findings to suggest pathologic injury. Multiple small sclerotic lesions are seen in the imaged pelvis and femoral head and neck consistent with metastatic breast carcinoma. Sigmoid diverticulosis. Electronically Signed   By: Inge Rise M.D.    On: 07/06/2015 11:01   Pelvis Portable  07/06/2015  CLINICAL DATA:  Status post left femur fracture repair EXAM: PORTABLE PELVIS 1-2 VIEWS COMPARISON:  None. FINDINGS: Interval placement of a left intra medullary nail with an interlocking femoral neck screw transfixing a proximal left femoral diaphysis fracture in near anatomic alignment on a AP view. Surrounding postsurgical changes in the surrounding soft tissues. Sclerotic lesions are noted throughout the pelvis and bilateral femurs consistent with IMPRESSION: ORIF proximal left femoral diaphysis fracture. Multiple sclerotic osseous lesions again noted in the pelvis and proximal femurs consistent with metastatic disease. Electronically Signed   By: Kathreen Devoid   On: 07/06/2015 21:34   Dg C-arm 1-60 Min-no Report  07/06/2015  CLINICAL DATA: surgery C-ARM 1-60 MINUTES Fluoroscopy was utilized by the requesting physician.  No radiographic interpretation.   Dg Hip Unilat With Pelvis 2-3 Views Left  07/05/2015  CLINICAL DATA:  Status post fall. EXAM: DG HIP (WITH OR WITHOUT PELVIS) 2-3V LEFT COMPARISON:  None. FINDINGS: There is no left hip fracture or dislocation. There is a displaced and angulated fracture of the left proximal femoral diaphysis with 15 mm of lateral and 3 cm of posterior displacement and apex lateral angulation. There are sclerotic lesions scattered throughout the pelvis and proximal bilateral femurs most concerning for metastatic disease. IMPRESSION: 1. Displaced and angulated fracture of the left proximal femoral diaphysis . 2. 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 Electronically Signed   By: Kathreen Devoid   On: 07/05/2015 17:24   Dg Femur Min 2 Views Left  07/06/2015  CLINICAL DATA:  Left femur fracture.  Adamantinoma. EXAM: LEFT FEMUR 2 VIEWS; DG C-ARM 1-60 MIN-NO REPORT COMPARISON:  None. FINDINGS: The patient is status post ORIF with placement of an intra  medullary rod. The fracture is aligned on the AP view with less than 1 width of the cortex displacement remaining on the lateral image. A dynamic hip screws in place. A single distal interlocking screw is noted. The knee and hip are located. IMPRESSION: Status post ORIF of the proximal left femur fracture without radiographic evidence for complication. Electronically Signed   By: San Morelle M.D.   On: 07/06/2015 20:24   Dg Femur Min 2 Views Left  07/05/2015  CLINICAL DATA:  69 year old female with history of trauma from a fall complaining of left upper leg pain. EXAM: LEFT FEMUR 2 VIEWS COMPARISON:  No priors. FINDINGS: Two nonstandard views of the left femur are submitted for evaluation. There is a displaced foreshortened fracture in the proximal third of the left femoral diaphysis, with approximately 1 shaft width of posterior displacement and at least 3.9 cm of proximal migration of the distal femoral fragment, and approximately 30 degrees of apex anterolateral angulation. Femoral head appears located. IMPRESSION: 1. Acute  displaced foreshortened and angulated fracture of the proximal third of the left femoral diaphysis, as above. Electronically Signed   By: Vinnie Langton M.D.   On: 07/05/2015 17:22   Dg Femur Port Min 2 Views Left  07/06/2015  CLINICAL DATA:  69 year old female status post intra medullary rod fixation rod placement in the left femur EXAM: LEFT FEMUR PORTABLE 2 VIEWS COMPARISON:  Intraoperative radiograph dated 07/06/2015 FINDINGS: Status post open reduction and internal fixation with placement of and intra medullary rod traversing the fracture of the proximal diaphysis of the left femur. There is approximately 1-2 mm cortical displacement at the fracture site on the frontal projection and 3-4 mm cortical displacement on the cross-table lateral projection. A fixation screw traverses the left femoral neck. Soft tissue gas noted in the lateral aspect of the proximal thigh  compatible with postsurgical changes. IMPRESSION: Internal medullary rod traversing left femoral fracture as described. Electronically Signed   By: Anner Crete M.D.   On: 07/06/2015 21:46    Microbiology: Recent Results (from the past 240 hour(s))  Surgical pcr screen     Status: Abnormal   Collection Time: 07/06/15 12:46 PM  Result Value Ref Range Status   MRSA, PCR NEGATIVE NEGATIVE Final   Staphylococcus aureus POSITIVE (A) NEGATIVE Final    Comment:        The Xpert SA Assay (FDA approved for NASAL specimens in patients over 17 years of age), is one component of a comprehensive surveillance program.  Test performance has been validated by [facility] for patients greater than or equal to 39 year old. It is not intended to diagnose infection nor to guide or monitor treatment.      Labs: Basic Metabolic Panel:  Recent Labs Lab 07/05/15 1732 07/05/15 1832 07/06/15 0501 07/07/15 0641 07/08/15 0536  NA 142  --  138 141 142  K 3.5  --  3.8 3.8 4.0  CL 108  --  108 108 109  CO2 27  --  22 27 29   GLUCOSE 132*  --  103* 189* 121*  BUN 13  --  14 11 14   CREATININE 0.60  --  0.64 0.63 0.67  CALCIUM 8.9  --  8.3* 7.9* 8.4*  MG  --  1.8  --   --   --    Liver Function Tests:  Recent Labs Lab 07/06/15 0501  AST 20  ALT 9*  ALKPHOS 45  BILITOT 0.8  PROT 6.5  ALBUMIN 3.7   No results for input(s): LIPASE, AMYLASE in the last 168 hours. No results for input(s): AMMONIA in the last 168 hours. CBC:  Recent Labs Lab 07/05/15 1732 07/06/15 0501 07/07/15 0500 07/08/15 0536  WBC 9.2 8.9 9.5 9.6  NEUTROABS 7.3  --   --   --   HGB 12.7 12.6 11.9* 9.8*  HCT 37.4 37.3 34.2* 28.7*  MCV 94.4 95.2 92.4 94.7  PLT 220 248 183 199   Cardiac Enzymes:  Recent Labs Lab 07/05/15 1732 07/05/15 2310 07/06/15 0501  TROPONINI <0.03 <0.03 0.03   BNP: BNP (last 3 results) No results for input(s): BNP in the last 8760 hours.  ProBNP (last 3 results) No results for  input(s): PROBNP in the last 8760 hours.  CBG: No results for input(s): GLUCAP in the last 168 hours.     SignedCristal Ford  [facility] 07/08/2015, 9:54 AM

## 2015-09-13 ENCOUNTER — Ambulatory Visit: Payer: Self-pay | Admitting: Orthopedic Surgery

## 2015-09-15 ENCOUNTER — Encounter (HOSPITAL_COMMUNITY)
Admission: RE | Admit: 2015-09-15 | Discharge: 2015-09-15 | Disposition: A | Discharge status: Home / Self Care | Payer: Medicare Other | Source: Ambulatory Visit | Attending: Orthopedic Surgery | Admitting: Orthopedic Surgery

## 2015-09-15 ENCOUNTER — Encounter (HOSPITAL_COMMUNITY): Payer: Self-pay

## 2015-09-15 DIAGNOSIS — Z01812 Encounter for preprocedural laboratory examination: Secondary | ICD-10-CM | POA: Diagnosis present

## 2015-09-15 DIAGNOSIS — S7292XA Unspecified fracture of left femur, initial encounter for closed fracture: Secondary | ICD-10-CM | POA: Diagnosis not present

## 2015-09-15 DIAGNOSIS — X58XXXA Exposure to other specified factors, initial encounter: Secondary | ICD-10-CM | POA: Diagnosis not present

## 2015-09-15 HISTORY — DX: Major depressive disorder, single episode, unspecified: F32.9

## 2015-09-15 HISTORY — DX: Cardiac murmur, unspecified: R01.1

## 2015-09-15 HISTORY — DX: Cardiac arrhythmia, unspecified: I49.9

## 2015-09-15 HISTORY — DX: Personal history of other medical treatment: Z92.89

## 2015-09-15 HISTORY — DX: Depression, unspecified: F32.A

## 2015-09-15 LAB — CBC
HCT: 40.7 % (ref 36.0–46.0)
Hemoglobin: 12.9 g/dL (ref 12.0–15.0)
MCH: 30.7 pg (ref 26.0–34.0)
MCHC: 31.7 g/dL (ref 30.0–36.0)
MCV: 96.9 fL (ref 78.0–100.0)
PLATELETS: 309 10*3/uL (ref 150–400)
RBC: 4.2 MIL/uL (ref 3.87–5.11)
RDW: 13 % (ref 11.5–15.5)
WBC: 9.5 10*3/uL (ref 4.0–10.5)

## 2015-09-15 LAB — BASIC METABOLIC PANEL
ANION GAP: 12 (ref 5–15)
BUN: 19 mg/dL (ref 6–20)
CO2: 27 mmol/L (ref 22–32)
Calcium: 10 mg/dL (ref 8.9–10.3)
Chloride: 102 mmol/L (ref 101–111)
Creatinine, Ser: 0.65 mg/dL (ref 0.44–1.00)
Glucose, Bld: 98 mg/dL (ref 65–99)
POTASSIUM: 4.7 mmol/L (ref 3.5–5.1)
SODIUM: 141 mmol/L (ref 135–145)

## 2015-09-15 NOTE — Pre-Procedure Instructions (Signed)
EKG 09-02-15, 9'16 -with chart. LOV notes (Dr. Bertell Maria ) 09-02-15 with chart. Dr.R. Horace Porteous reviewed EKG 09-02-15 and cardiology office visit note  of 09-02-15 with chart.

## 2015-09-15 NOTE — Patient Instructions (Addendum)
[name]  09/15/2015   Your procedure is scheduled on: 2- 13 -17   Report to [facility]  Entrance take [facility]  elevators to 3rd floor to  [place] at 1100 AM.  Call this number if you have problems the morning of surgery [phone]   Remember: ONLY 1 PERSON MAY GO WITH YOU TO SHORT STAY TO GET  READY MORNING OF [place].  Do not eat food or drink liquids :After Midnight. Exception- may have Clear Liquids 12 midnight until 0700 AM, then nothing.   CLEAR LIQUID DIET   Foods Allowed                                                                     Foods Excluded  Coffee and tea, regular and decaf                             liquids that you cannot  Plain Jell-O in any flavor                                             see through such as: Fruit ices (not with fruit pulp)                                     milk, soups, orange juice  Iced Popsicles                                    All solid food Carbonated beverages, regular and diet                                    Cranberry, grape and apple juices Sports drinks like Gatorade Lightly seasoned clear broth or consume(fat free) Sugar, honey syrup  Sample Menu Breakfast                                Lunch                                     Supper Cranberry juice                    Beef broth                            Chicken broth Jell-O                                     Grape juice  Apple juice Coffee or tea                        Jell-O                                      Popsicle                                                Coffee or tea                        Coffee or tea  _____________________________________________________________________       Take these medicines the morning of surgery with A SIP OF WATER: Aromasin. Metoprolol. Sertraline(Zoloft). DO NOT TAKE ANY DIABETIC MEDICATIONS DAY OF YOUR SURGERY                               You  may not have any metal on your body including hair pins and              piercings  Do not wear jewelry, make-up, lotions, powders or perfumes, deodorant             Do not wear nail polish.  Do not shave  48 hours prior to surgery.              Men may shave face and neck.   Do not bring valuables to the hospital. [place].  Contacts, dentures or bridgework may not be worn into surgery.  Leave suitcase in the car. After surgery it may be brought to your room.     Patients discharged the day of surgery will not be allowed to drive home.  Name and phone number of your driver:John Kosch -spouse  Special Instructions: N/A              Please read over the following fact sheets you were given: _____________________________________________________________________             [facility] - Preparing for Surgery Before surgery, you can play an important role.  Because skin is not sterile, your skin needs to be as free of germs as possible.  You can reduce the number of germs on your skin by washing with CHG (chlorahexidine gluconate) soap before surgery.  CHG is an antiseptic cleaner which kills germs and bonds with the skin to continue killing germs even after washing. Please DO NOT use if you have an allergy to CHG or antibacterial soaps.  If your skin becomes reddened/irritated stop using the CHG and inform your nurse when you arrive at Short Stay. Do not shave (including legs and underarms) for at least 48 hours prior to the first CHG shower.  You may shave your face/neck. Please follow these instructions carefully:  1.  Shower with CHG Soap the night before surgery and the  morning of Surgery.  2.  If you choose to wash your hair, wash your hair first as usual with your  normal  shampoo.  3.  After you shampoo, rinse your hair and body thoroughly  to remove the  shampoo.                           4.  Use CHG as you would any other liquid  soap.  You can apply chg directly  to the skin and wash                       Gently with a scrungie or clean washcloth.  5.  Apply the CHG Soap to your body ONLY FROM THE NECK DOWN.   Do not use on face/ open                           Wound or open sores. Avoid contact with eyes, ears mouth and genitals (private parts).                       Wash face,  Genitals (private parts) with your normal soap.             6.  Wash thoroughly, paying special attention to the area where your surgery  will be performed.  7.  Thoroughly rinse your body with warm water from the neck down.  8.  DO NOT shower/wash with your normal soap after using and rinsing off  the CHG Soap.                9.  Pat yourself dry with a clean towel.            10.  Wear clean pajamas.            11.  Place clean sheets on your bed the night of your first shower and do not  sleep with pets. Day of Surgery : Do not apply any lotions/deodorants the morning of surgery.  Please wear clean clothes to the hospital/surgery center.  FAILURE TO FOLLOW THESE INSTRUCTIONS MAY RESULT IN THE CANCELLATION OF YOUR SURGERY PATIENT SIGNATURE_________________________________  NURSE SIGNATURE__________________________________  ________________________________________________________________________  Adam Phenix  An incentive spirometer is a tool that can help keep your lungs clear and active. This tool measures how well you are filling your lungs with each breath. Taking long deep breaths may help reverse or decrease the chance of developing breathing (pulmonary) problems (especially infection) following:  A long period of time when you are unable to move or be active. BEFORE THE PROCEDURE   If the spirometer includes an indicator to show your best effort, your nurse or respiratory therapist will set it to a desired goal.  If possible, sit up straight or lean slightly forward. Try not to slouch.  Hold the incentive spirometer  in an upright position. INSTRUCTIONS FOR USE  1. Sit on the edge of your bed if possible, or sit up as far as you can in bed or on a chair. 2. Hold the incentive spirometer in an upright position. 3. Breathe out normally. 4. Place the mouthpiece in your mouth and seal your lips tightly around it. 5. Breathe in slowly and as deeply as possible, raising the piston or the ball toward the top of the column. 6. Hold your breath for 3-5 seconds or for as long as possible. Allow the piston or ball to fall to the bottom of the column. 7. Remove the mouthpiece from your mouth and breathe out normally. 8. Rest for a few seconds and repeat Steps 1 through 7 at  least 10 times every 1-2 hours when you are awake. Take your time and take a few normal breaths between deep breaths. 9. The spirometer may include an indicator to show your best effort. Use the indicator as a goal to work toward during each repetition. 10. After each set of 10 deep breaths, practice coughing to be sure your lungs are clear. If you have an incision (the cut made at the time of surgery), support your incision when coughing by placing a pillow or rolled up towels firmly against it. Once you are able to get out of bed, walk around indoors and cough well. You may stop using the incentive spirometer when instructed by your caregiver.  RISKS AND COMPLICATIONS  Take your time so you do not get dizzy or light-headed.  If you are in pain, you may need to take or ask for pain medication before doing incentive spirometry. It is harder to take a deep breath if you are having pain. AFTER USE  Rest and breathe slowly and easily.  It can be helpful to keep track of a log of your progress. Your caregiver can provide you with a simple table to help with this. If you are using the spirometer at home, follow these instructions: [place] IF:   You are having difficultly using the spirometer.  You have trouble using the spirometer as  often as instructed.  Your pain medication is not giving enough relief while using the spirometer.  You develop fever of 100.5 F (38.1 C) or higher. SEEK IMMEDIATE MEDICAL CARE IF:   You cough up bloody sputum that had not been present before.  You develop fever of 102 F (38.9 C) or greater.  You develop worsening pain at or near the incision site. MAKE SURE YOU:   Understand these instructions.  Will watch your condition.  Will get help right away if you are not doing well or get worse. Document Released: 12/03/2006 Document Revised: 10/15/2011 Document Reviewed: 02/03/2007 [facility] Patient Information 2014 [place], [place].   ________________________________________________________________________

## 2015-09-19 ENCOUNTER — Encounter (HOSPITAL_COMMUNITY): Admission: RE | Payer: Self-pay | Source: Ambulatory Visit

## 2015-09-19 ENCOUNTER — Inpatient Hospital Stay (HOSPITAL_COMMUNITY): Admission: RE | Admit: 2015-09-19 | Payer: Medicare Other | Source: Ambulatory Visit | Admitting: Orthopedic Surgery

## 2015-09-19 SURGERY — INSERTION, INTRAMEDULLARY ROD, FEMUR
Anesthesia: Choice | Laterality: Right

## 2015-09-22 NOTE — Progress Notes (Signed)
Pt notified of new surgery date and time. Pt aware to arrive at [facility] on 09/29/2015 at 10:45am. No food or drink after midnight. Pt verbalized understanding.

## 2015-09-23 ENCOUNTER — Ambulatory Visit: Payer: Self-pay | Admitting: Orthopedic Surgery

## 2015-09-29 ENCOUNTER — Observation Stay (HOSPITAL_COMMUNITY): Payer: Medicare Other

## 2015-09-29 ENCOUNTER — Encounter (HOSPITAL_COMMUNITY)
Admission: AD | Disposition: A | Discharge status: Home / Self Care | Payer: Self-pay | Source: Ambulatory Visit | Attending: Orthopedic Surgery

## 2015-09-29 ENCOUNTER — Inpatient Hospital Stay (HOSPITAL_COMMUNITY): Payer: Medicare Other | Admitting: Certified Registered Nurse Anesthetist

## 2015-09-29 ENCOUNTER — Inpatient Hospital Stay (HOSPITAL_COMMUNITY): Payer: Medicare Other

## 2015-09-29 ENCOUNTER — Encounter (HOSPITAL_COMMUNITY): Payer: Self-pay | Admitting: *Deleted

## 2015-09-29 ENCOUNTER — Inpatient Hospital Stay (HOSPITAL_COMMUNITY)
Admission: AD | Admit: 2015-09-29 | Discharge: 2015-09-30 | DRG: 482 | Disposition: A | Discharge status: Home / Self Care | Payer: Medicare Other | Source: Ambulatory Visit | Attending: Orthopedic Surgery | Admitting: Orthopedic Surgery

## 2015-09-29 DIAGNOSIS — W19XXXA Unspecified fall, initial encounter: Secondary | ICD-10-CM | POA: Diagnosis present

## 2015-09-29 DIAGNOSIS — S7291XA Unspecified fracture of right femur, initial encounter for closed fracture: Secondary | ICD-10-CM | POA: Diagnosis present

## 2015-09-29 DIAGNOSIS — S72301A Unspecified fracture of shaft of right femur, initial encounter for closed fracture: Principal | ICD-10-CM | POA: Diagnosis present

## 2015-09-29 DIAGNOSIS — Z881 Allergy status to other antibiotic agents status: Secondary | ICD-10-CM

## 2015-09-29 DIAGNOSIS — Z853 Personal history of malignant neoplasm of breast: Secondary | ICD-10-CM

## 2015-09-29 DIAGNOSIS — I10 Essential (primary) hypertension: Secondary | ICD-10-CM | POA: Diagnosis present

## 2015-09-29 DIAGNOSIS — Z79899 Other long term (current) drug therapy: Secondary | ICD-10-CM

## 2015-09-29 DIAGNOSIS — Z09 Encounter for follow-up examination after completed treatment for conditions other than malignant neoplasm: Secondary | ICD-10-CM

## 2015-09-29 HISTORY — PX: FEMUR IM NAIL: SHX1597

## 2015-09-29 LAB — URINALYSIS, ROUTINE W REFLEX MICROSCOPIC
Bilirubin Urine: NEGATIVE
Glucose, UA: NEGATIVE mg/dL
Hgb urine dipstick: NEGATIVE
Ketones, ur: NEGATIVE mg/dL
LEUKOCYTES UA: NEGATIVE
NITRITE: NEGATIVE
PH: 6 (ref 5.0–8.0)
Protein, ur: NEGATIVE mg/dL
SPECIFIC GRAVITY, URINE: 1.02 (ref 1.005–1.030)

## 2015-09-29 LAB — COMPREHENSIVE METABOLIC PANEL
ALBUMIN: 4.2 g/dL (ref 3.5–5.0)
ALK PHOS: 112 U/L (ref 38–126)
ALT: 14 U/L (ref 14–54)
ANION GAP: 9 (ref 5–15)
AST: 19 U/L (ref 15–41)
BILIRUBIN TOTAL: 0.7 mg/dL (ref 0.3–1.2)
BUN: 19 mg/dL (ref 6–20)
CALCIUM: 9.2 mg/dL (ref 8.9–10.3)
CO2: 26 mmol/L (ref 22–32)
CREATININE: 0.59 mg/dL (ref 0.44–1.00)
Chloride: 103 mmol/L (ref 101–111)
GFR calc Af Amer: >60 mL/min (ref >60)
GFR calc non Af Amer: >60 mL/min (ref >60)
GLUCOSE: 116 mg/dL — AB (ref 65–99)
Potassium: 4 mmol/L (ref 3.5–5.1)
Sodium: 138 mmol/L (ref 135–145)
TOTAL PROTEIN: 7.6 g/dL (ref 6.5–8.1)

## 2015-09-29 LAB — PROTIME-INR
INR: 1.08 (ref 0.00–1.49)
Prothrombin Time: 13.8 seconds (ref 11.6–15.2)

## 2015-09-29 LAB — CBC WITH DIFFERENTIAL/PLATELET
BASOS PCT: 0 %
Basophils Absolute: 0 10*3/uL (ref 0.0–0.1)
Eosinophils Absolute: 0.1 10*3/uL (ref 0.0–0.7)
Eosinophils Relative: 1 %
HEMATOCRIT: 40.2 % (ref 36.0–46.0)
HEMOGLOBIN: 12.9 g/dL (ref 12.0–15.0)
Lymphocytes Relative: 35 %
Lymphs Abs: 2.3 10*3/uL (ref 0.7–4.0)
MCH: 30.8 pg (ref 26.0–34.0)
MCHC: 32.1 g/dL (ref 30.0–36.0)
MCV: 95.9 fL (ref 78.0–100.0)
MONOS PCT: 6 %
Monocytes Absolute: 0.4 10*3/uL (ref 0.1–1.0)
NEUTROS ABS: 4 10*3/uL (ref 1.7–7.7)
NEUTROS PCT: 58 %
Platelets: 238 10*3/uL (ref 150–400)
RBC: 4.19 MIL/uL (ref 3.87–5.11)
RDW: 13 % (ref 11.5–15.5)
WBC: 6.7 10*3/uL (ref 4.0–10.5)

## 2015-09-29 LAB — APTT: aPTT: 30 seconds (ref 24–37)

## 2015-09-29 LAB — TYPE AND SCREEN
ABO/RH(D): AB POS
Antibody Screen: NEGATIVE

## 2015-09-29 SURGERY — INSERTION, INTRAMEDULLARY ROD, FEMUR
Anesthesia: General | Laterality: Right

## 2015-09-29 MED ORDER — CEFAZOLIN SODIUM-DEXTROSE 2-3 GM-% IV SOLR
INTRAVENOUS | Status: AC
  Filled 2015-09-29: qty 50

## 2015-09-29 MED ORDER — ISOPROPYL ALCOHOL 70 % SOLN
Status: DC | PRN
  Administered 2015-09-29: 1 via TOPICAL

## 2015-09-29 MED ORDER — MIDAZOLAM HCL 5 MG/5ML IJ SOLN
INTRAMUSCULAR | Status: DC | PRN
  Administered 2015-09-29: 2 mg via INTRAVENOUS

## 2015-09-29 MED ORDER — METOPROLOL TARTRATE 25 MG PO TABS
25.0000 mg | ORAL_TABLET | Freq: Two times a day (BID) | ORAL | Status: DC
  Administered 2015-09-30: 25 mg via ORAL
  Filled 2015-09-29 (×4): qty 1

## 2015-09-29 MED ORDER — PROMETHAZINE HCL 25 MG/ML IJ SOLN: 6.2500 mg | INTRAMUSCULAR | Status: DC | PRN

## 2015-09-29 MED ORDER — FENTANYL CITRATE (PF) 250 MCG/5ML IJ SOLN
INTRAMUSCULAR | Status: AC
  Filled 2015-09-29: qty 5

## 2015-09-29 MED ORDER — SODIUM CHLORIDE 0.9 % IV SOLN: INTRAVENOUS | Status: DC

## 2015-09-29 MED ORDER — ACETAMINOPHEN 650 MG RE SUPP: 650.0000 mg | Freq: Four times a day (QID) | RECTAL | Status: DC | PRN

## 2015-09-29 MED ORDER — ROCURONIUM BROMIDE 100 MG/10ML IV SOLN
INTRAVENOUS | Status: DC | PRN
  Administered 2015-09-29: 25 mg via INTRAVENOUS

## 2015-09-29 MED ORDER — METOCLOPRAMIDE HCL 10 MG PO TABS: 5.0000 mg | ORAL_TABLET | Freq: Three times a day (TID) | ORAL | Status: DC | PRN

## 2015-09-29 MED ORDER — ACETAMINOPHEN 325 MG PO TABS: 650.0000 mg | ORAL_TABLET | Freq: Four times a day (QID) | ORAL | Status: DC | PRN

## 2015-09-29 MED ORDER — MORPHINE SULFATE (PF) 2 MG/ML IV SOLN: 0.5000 mg | INTRAVENOUS | Status: DC | PRN

## 2015-09-29 MED ORDER — LACTATED RINGERS IV SOLN
INTRAVENOUS | Status: DC | PRN
  Administered 2015-09-29 (×2): via INTRAVENOUS

## 2015-09-29 MED ORDER — ONDANSETRON HCL 4 MG PO TABS: 4.0000 mg | ORAL_TABLET | Freq: Four times a day (QID) | ORAL | Status: DC | PRN

## 2015-09-29 MED ORDER — PROPOFOL 10 MG/ML IV BOLUS
INTRAVENOUS | Status: DC | PRN
  Administered 2015-09-29: 110 mg via INTRAVENOUS

## 2015-09-29 MED ORDER — SODIUM CHLORIDE 0.9 % IR SOLN
Status: DC | PRN
  Administered 2015-09-29: 1000 mL

## 2015-09-29 MED ORDER — EPHEDRINE SULFATE 50 MG/ML IJ SOLN
INTRAMUSCULAR | Status: DC | PRN
  Administered 2015-09-29: 5 mg via INTRAVENOUS
  Administered 2015-09-29: 10 mg via INTRAVENOUS

## 2015-09-29 MED ORDER — SUCCINYLCHOLINE CHLORIDE 20 MG/ML IJ SOLN
INTRAMUSCULAR | Status: DC | PRN
  Administered 2015-09-29: 100 mg via INTRAVENOUS

## 2015-09-29 MED ORDER — SUGAMMADEX SODIUM 200 MG/2ML IV SOLN
INTRAVENOUS | Status: DC | PRN
Start: 2015-09-29 — End: 2015-09-29
  Administered 2015-09-29: 200 mg via INTRAVENOUS

## 2015-09-29 MED ORDER — MENTHOL 3 MG MT LOZG: 1.0000 | LOZENGE | OROMUCOSAL | Status: DC | PRN

## 2015-09-29 MED ORDER — HYDROMORPHONE HCL 1 MG/ML IJ SOLN
0.2500 mg | INTRAMUSCULAR | Status: DC | PRN
  Administered 2015-09-29: 0.5 mg via INTRAVENOUS

## 2015-09-29 MED ORDER — ONDANSETRON HCL 4 MG/2ML IJ SOLN
INTRAMUSCULAR | Status: DC | PRN
  Administered 2015-09-29: 4 mg via INTRAVENOUS

## 2015-09-29 MED ORDER — PHENYLEPHRINE HCL 10 MG/ML IJ SOLN
10.0000 mg | INTRAVENOUS | Status: DC | PRN
  Administered 2015-09-29: 50 ug/min via INTRAVENOUS

## 2015-09-29 MED ORDER — MIDAZOLAM HCL 2 MG/2ML IJ SOLN
INTRAMUSCULAR | Status: AC
  Filled 2015-09-29: qty 2

## 2015-09-29 MED ORDER — LIDOCAINE HCL (CARDIAC) 20 MG/ML IV SOLN
INTRAVENOUS | Status: AC
  Filled 2015-09-29: qty 5

## 2015-09-29 MED ORDER — DOCUSATE SODIUM 100 MG PO CAPS
100.0000 mg | ORAL_CAPSULE | Freq: Two times a day (BID) | ORAL | Status: DC
  Administered 2015-09-29 – 2015-09-30 (×2): 100 mg via ORAL

## 2015-09-29 MED ORDER — CLINDAMYCIN PHOSPHATE 900 MG/50ML IV SOLN
900.0000 mg | INTRAVENOUS | Status: AC
  Administered 2015-09-29: 900 mg via INTRAVENOUS

## 2015-09-29 MED ORDER — CEFAZOLIN SODIUM-DEXTROSE 2-3 GM-% IV SOLR
2.0000 g | Freq: Four times a day (QID) | INTRAVENOUS | Status: AC
  Administered 2015-09-29 – 2015-09-30 (×2): 2 g via INTRAVENOUS
  Filled 2015-09-29 (×2): qty 50

## 2015-09-29 MED ORDER — PHENYLEPHRINE HCL 10 MG/ML IJ SOLN
INTRAMUSCULAR | Status: AC
  Filled 2015-09-29: qty 1

## 2015-09-29 MED ORDER — CALCIUM CARBONATE-VITAMIN D 500-200 MG-UNIT PO TABS
1.0000 | ORAL_TABLET | Freq: Two times a day (BID) | ORAL | Status: DC
  Administered 2015-09-29 – 2015-09-30 (×2): 1 via ORAL
  Filled 2015-09-29 (×4): qty 1

## 2015-09-29 MED ORDER — LISINOPRIL 20 MG PO TABS
20.0000 mg | ORAL_TABLET | Freq: Every day | ORAL | Status: DC
  Filled 2015-09-29 (×2): qty 1

## 2015-09-29 MED ORDER — CEFAZOLIN SODIUM-DEXTROSE 2-3 GM-% IV SOLR
2.0000 g | INTRAVENOUS | Status: AC
  Administered 2015-09-29: 2 g via INTRAVENOUS

## 2015-09-29 MED ORDER — PHENOL 1.4 % MT LIQD
1.0000 | OROMUCOSAL | Status: DC | PRN
Start: 2015-09-29 — End: 2015-09-30

## 2015-09-29 MED ORDER — ENOXAPARIN SODIUM 30 MG/0.3ML ~~LOC~~ SOLN
30.0000 mg | SUBCUTANEOUS | Status: DC
  Administered 2015-09-30: 30 mg via SUBCUTANEOUS
  Filled 2015-09-29 (×2): qty 0.3

## 2015-09-29 MED ORDER — FENTANYL CITRATE (PF) 100 MCG/2ML IJ SOLN
INTRAMUSCULAR | Status: DC | PRN
  Administered 2015-09-29 (×4): 50 ug via INTRAVENOUS

## 2015-09-29 MED ORDER — DEXAMETHASONE SODIUM PHOSPHATE 10 MG/ML IJ SOLN
INTRAMUSCULAR | Status: DC | PRN
  Administered 2015-09-29: 10 mg via INTRAVENOUS

## 2015-09-29 MED ORDER — ONDANSETRON HCL 4 MG/2ML IJ SOLN: 4.0000 mg | Freq: Four times a day (QID) | INTRAMUSCULAR | Status: DC | PRN

## 2015-09-29 MED ORDER — EPHEDRINE SULFATE 50 MG/ML IJ SOLN
INTRAMUSCULAR | Status: AC
  Filled 2015-09-29: qty 1

## 2015-09-29 MED ORDER — ISOPROPYL ALCOHOL 70 % SOLN
Status: AC
  Filled 2015-09-29: qty 480

## 2015-09-29 MED ORDER — ONDANSETRON HCL 4 MG/2ML IJ SOLN
INTRAMUSCULAR | Status: AC
  Filled 2015-09-29: qty 2

## 2015-09-29 MED ORDER — SENNA 8.6 MG PO TABS
1.0000 | ORAL_TABLET | Freq: Two times a day (BID) | ORAL | Status: DC
  Administered 2015-09-30: 8.6 mg via ORAL

## 2015-09-29 MED ORDER — PROPOFOL 10 MG/ML IV BOLUS
INTRAVENOUS | Status: AC
  Filled 2015-09-29: qty 20

## 2015-09-29 MED ORDER — ATROPINE SULFATE 0.4 MG/ML IJ SOLN
INTRAMUSCULAR | Status: AC
  Filled 2015-09-29: qty 1

## 2015-09-29 MED ORDER — CHLORHEXIDINE GLUCONATE 4 % EX LIQD: 60.0000 mL | Freq: Once | CUTANEOUS | Status: DC

## 2015-09-29 MED ORDER — SUGAMMADEX SODIUM 200 MG/2ML IV SOLN
INTRAVENOUS | Status: AC
  Filled 2015-09-29: qty 2

## 2015-09-29 MED ORDER — TRANEXAMIC ACID 1000 MG/10ML IV SOLN
1000.0000 mg | INTRAVENOUS | Status: AC
  Administered 2015-09-29: 1000 mg via INTRAVENOUS
  Filled 2015-09-29 (×2): qty 10

## 2015-09-29 MED ORDER — SODIUM CHLORIDE 0.9 % IJ SOLN
INTRAMUSCULAR | Status: AC
  Filled 2015-09-29: qty 10

## 2015-09-29 MED ORDER — SERTRALINE HCL 25 MG PO TABS
25.0000 mg | ORAL_TABLET | Freq: Every day | ORAL | Status: DC
  Administered 2015-09-30: 25 mg via ORAL
  Filled 2015-09-29: qty 1

## 2015-09-29 MED ORDER — HYDROMORPHONE HCL 1 MG/ML IJ SOLN
INTRAMUSCULAR | Status: AC
  Filled 2015-09-29: qty 1

## 2015-09-29 MED ORDER — EXEMESTANE 25 MG PO TABS
25.0000 mg | ORAL_TABLET | Freq: Every morning | ORAL | Status: DC
  Administered 2015-09-30: 25 mg via ORAL
  Filled 2015-09-29: qty 1

## 2015-09-29 MED ORDER — HYDROCODONE-ACETAMINOPHEN 5-325 MG PO TABS
1.0000 | ORAL_TABLET | Freq: Four times a day (QID) | ORAL | Status: DC | PRN
  Administered 2015-09-29 – 2015-09-30 (×2): 1 via ORAL
  Administered 2015-09-30: 2 via ORAL
  Filled 2015-09-29 (×2): qty 1
  Filled 2015-09-29: qty 2

## 2015-09-29 MED ORDER — LIDOCAINE HCL (CARDIAC) 20 MG/ML IV SOLN
INTRAVENOUS | Status: DC | PRN
  Administered 2015-09-29: 60 mg via INTRAVENOUS

## 2015-09-29 MED ORDER — CLINDAMYCIN PHOSPHATE 900 MG/50ML IV SOLN
INTRAVENOUS | Status: AC
  Filled 2015-09-29: qty 50

## 2015-09-29 MED ORDER — SACCHAROMYCES BOULARDII 250 MG PO CAPS
250.0000 mg | ORAL_CAPSULE | Freq: Every day | ORAL | Status: DC
  Administered 2015-09-30: 250 mg via ORAL
  Filled 2015-09-29: qty 1

## 2015-09-29 MED ORDER — METOCLOPRAMIDE HCL 5 MG/ML IJ SOLN: 5.0000 mg | Freq: Three times a day (TID) | INTRAMUSCULAR | Status: DC | PRN

## 2015-09-29 MED ORDER — SODIUM CHLORIDE 0.9 % IV SOLN
INTRAVENOUS | Status: DC
  Administered 2015-09-29: 17:00:00 via INTRAVENOUS

## 2015-09-29 MED ORDER — PROBIOTIC PO CAPS: ORAL_CAPSULE | Freq: Every morning | ORAL | Status: DC

## 2015-09-29 SURGICAL SUPPLY — 43 items
2.5MM REAMING ROD WITH BALL/950MM-STERILE ×3 IMPLANT
BIT DRILL 4.2 (DRILL) ×1 IMPLANT
CHLORAPREP W/TINT 26ML (MISCELLANEOUS) ×3 IMPLANT
COVER PERINEAL POST (MISCELLANEOUS) ×3 IMPLANT
DRAPE C-ARM 42X120 X-RAY (DRAPES) ×3 IMPLANT
DRAPE C-ARMOR (DRAPES) ×3 IMPLANT
DRAPE ORTHO SPLIT 77X108 STRL (DRAPES) ×4
DRAPE STERI IOBAN 125X83 (DRAPES) ×3 IMPLANT
DRAPE SURG ORHT 6 SPLT 77X108 (DRAPES) ×2 IMPLANT
DRAPE U-SHAPE 47X51 STRL (DRAPES) ×6 IMPLANT
DRILL 4.2 (DRILL) ×3
DRSG MEPILEX BORDER 4X4 (GAUZE/BANDAGES/DRESSINGS) ×9 IMPLANT
ELECT BLADE TIP CTD 4 INCH (ELECTRODE) ×3 IMPLANT
FACESHIELD WRAPAROUND (MASK) ×3 IMPLANT
GAUZE SPONGE 4X4 12PLY STRL (GAUZE/BANDAGES/DRESSINGS) ×3 IMPLANT
GLOVE BIOGEL M 7.0 STRL (GLOVE) ×3 IMPLANT
GLOVE BIOGEL PI IND STRL 7.5 (GLOVE) ×3 IMPLANT
GLOVE BIOGEL PI IND STRL 8.5 (GLOVE) ×1 IMPLANT
GLOVE BIOGEL PI INDICATOR 7.5 (GLOVE) ×6
GLOVE BIOGEL PI INDICATOR 8.5 (GLOVE) ×2
GLOVE SURG ORTHO 8.5 STRL (GLOVE) ×9 IMPLANT
GLOVE SURG SS PI 7.5 STRL IVOR (GLOVE) ×3 IMPLANT
GOWN SPEC L3 XXLG W/TWL (GOWN DISPOSABLE) ×3 IMPLANT
GOWN STRL REUS W/ TWL LRG LVL3 (GOWN DISPOSABLE) ×1 IMPLANT
GOWN STRL REUS W/ TWL XL LVL3 (GOWN DISPOSABLE) ×1 IMPLANT
GOWN STRL REUS W/TWL LRG LVL3 (GOWN DISPOSABLE) ×2
GOWN STRL REUS W/TWL XL LVL3 (GOWN DISPOSABLE) ×2
GUIDEWIRE 3.2X400 (WIRE) ×6 IMPLANT
KIT BASIN OR (CUSTOM PROCEDURE TRAY) ×3 IMPLANT
LIQUID BAND (GAUZE/BANDAGES/DRESSINGS) ×3 IMPLANT
MANIFOLD NEPTUNE II (INSTRUMENTS) ×3 IMPLANT
MARKER SKIN DUAL TIP RULER LAB (MISCELLANEOUS) ×3 IMPLANT
NAIL CAN TFNA 9MM 130DEG HIP (Nail) ×3 IMPLANT
PACK TOTAL JOINT (CUSTOM PROCEDURE TRAY) ×3 IMPLANT
REAMER ROD DEEP FLUTE 2.5X950 (INSTRUMENTS) ×3 IMPLANT
SCREW LOCKING 5.0X32MM (Screw) ×3 IMPLANT
SCREW TFNA 90MM HIP (Orthopedic Implant) ×3 IMPLANT
SUT MNCRL AB 3-0 PS2 18 (SUTURE) ×6 IMPLANT
SUT VIC AB 1 CT1 27 (SUTURE) ×4
SUT VIC AB 1 CT1 27XBRD ANTBC (SUTURE) ×2 IMPLANT
SUT VIC AB 2-0 CT1 27 (SUTURE) ×4
SUT VIC AB 2-0 CT1 27XBRD (SUTURE) ×2 IMPLANT
YANKAUER SUCT BULB TIP NO VENT (SUCTIONS) ×3 IMPLANT

## 2015-09-29 NOTE — Anesthesia Preprocedure Evaluation (Addendum)
Anesthesia Evaluation  Patient identified by MRN, date of birth, ID band Patient awake    Reviewed: Allergy & Precautions, NPO status , Patient's Chart, lab work & pertinent test results  Airway Mallampati: II  TM Distance: >3 FB Neck ROM: Full    Dental no notable dental hx.    Pulmonary neg pulmonary ROS,    Pulmonary exam normal breath sounds clear to auscultation       Cardiovascular hypertension, Pt. on medications and Pt. on home beta blockers Normal cardiovascular exam+ dysrhythmias + Valvular Problems/Murmurs  Rhythm:Regular Rate:Normal  ECHO 07-06-15: Study Conclusions  - Left ventricle: The cavity size was normal. Wall thickness was normal. Systolic function was normal. The estimated ejection fraction was in the range of 60% to 65%. Wall motion was normal; there were no regional wall motion abnormalities. Doppler parameters are consistent with abnormal left ventricular relaxation (grade 1 diastolic dysfunction). - Mitral valve: There was mild regurgitation. - Pulmonary arteries: Systolic pressure was moderately increased. PA peak pressure: 48 mm Hg (S).    H/O non-sustained ventricular tachycardia.   Neuro/Psych PSYCHIATRIC DISORDERS Depression negative neurological ROS     GI/Hepatic negative GI ROS, Neg liver ROS,   Endo/Other  negative endocrine ROS  Renal/GU negative Renal ROS  negative genitourinary   Musculoskeletal negative musculoskeletal ROS (+)   Abdominal   Peds negative pediatric ROS (+)  Hematology  (+) anemia ,   Anesthesia Other Findings   Reproductive/Obstetrics negative OB ROS                            Anesthesia Physical Anesthesia Plan  ASA: II  Anesthesia Plan: General   Post-op Pain Management:    Induction: Intravenous  Airway Management Planned: Oral ETT  Additional Equipment:   Intra-op Plan:   Post-operative Plan:  Extubation in OR  Informed Consent: I have reviewed the patients History and Physical, chart, labs and discussed the procedure including the risks, benefits and alternatives for the proposed anesthesia with the patient or authorized representative who has indicated his/her understanding and acceptance.   Dental advisory given  Plan Discussed with: CRNA  Anesthesia Plan Comments:        Anesthesia Quick Evaluation

## 2015-09-29 NOTE — Anesthesia Postprocedure Evaluation (Signed)
Anesthesia Post Note  Patient: [name]  Procedure(s) Performed: Procedure(s) (LRB): INTRAMEDULLARY (IM) NAILRIGHT FEMUR (Right)  Patient location during evaluation: PACU Anesthesia Type: General Level of consciousness: awake and alert Pain management: pain level controlled Vital Signs Assessment: post-procedure vital signs reviewed and stable Respiratory status: spontaneous breathing, nonlabored ventilation, respiratory function stable and patient connected to nasal cannula oxygen Cardiovascular status: blood pressure returned to baseline and stable Postop Assessment: no signs of nausea or vomiting Anesthetic complications: no    Last Vitals:  Filed Vitals:   09/29/15 1532 09/29/15 1547  BP:  112/49  Pulse: 81   Temp:  36.8 C  Resp: 10     Last Pain:  Filed Vitals:   09/29/15 1548  PainSc: 5                  Chaniece Barbato J

## 2015-09-29 NOTE — H&P (Signed)
PREOPERATIVE H&P  Chief Complaint: right femur fracture  HPI: [name] is a 70 y.o. female who presents for preoperative history and physical with a diagnosis of impending atypical right femur fracture. Symptoms are rated as moderate to severe, and have been worsening.  This is significantly impairing activities of daily living.  She has elected for surgical management.   Past Medical History  Diagnosis Date  . Hypertension   . Cancer [facility])     left- radiation '02 and surgery(lumpectomy and axilla node dissection)  . Dysrhythmia     frequent PVC's and irregular heart beat-" Pulse oximeter not good indicator of heart rate" -evaluated by Dr Malon Kindle - [place] LOV 09-02-15 with chart.logy-  . Heart murmur   . Transfusion history     '72 - s/p childbirth  . Depression     mild during cancer tx- Zoloft controls   Past Surgical History  Procedure Laterality Date  . Breast surgery    . Tubal ligation    . Femur im nail Left 07/06/2015    Procedure: INTRAMEDULLARY (IM) NAIL FEMORAL;  Surgeon: Rod Can, MD;  Location: [facility] ORS;  Service: Orthopedics;  Laterality: Left;  . Cataract extraction, bilateral Bilateral    Social History   Social History  . Marital Status: Married    Spouse Name: N/A  . Number of Children: N/A  . Years of Education: N/A   Social History Main Topics  . Smoking status: Never Smoker   . Smokeless tobacco: None  . Alcohol Use: Yes  . Drug Use: No  . Sexual Activity: Not Asked   Other Topics Concern  . None   Social History Narrative   History reviewed. No pertinent family history. Allergies  Allergen Reactions  . Keflex [Cephalexin] Diarrhea   Prior to Admission medications   Medication Sig Start Date End Date Taking? Authorizing Provider  Calcium Carb-Cholecalciferol (CALCIUM 600 + D PO) Take 1 tablet by mouth 2 (two) times daily.   Yes Historical Provider, MD  exemestane (AROMASIN) 25 MG tablet Take 25 mg by mouth every  morning.  07/04/15  Yes Historical Provider, MD  lisinopril (PRINIVIL,ZESTRIL) 20 MG tablet Take 20 mg by mouth daily after lunch.  06/01/15  Yes Historical Provider, MD  metoprolol tartrate (LOPRESSOR) 25 MG tablet Take 25 mg by mouth 2 (two) times daily.   Yes Historical Provider, MD  Probiotic Product (PROBIOTIC PO) Take 1 capsule by mouth every morning.    Yes Historical Provider, MD  sertraline (ZOLOFT) 25 MG tablet Take 25 mg by mouth daily.   Yes Historical Provider, MD     Positive ROS: All other systems have been reviewed and were otherwise negative with the exception of those mentioned in the HPI and as above.  Physical Exam: General: Alert, no acute distress Cardiovascular: No pedal edema Respiratory: No cyanosis, no use of accessory musculature GI: No organomegaly, abdomen is soft and non-tender Skin: No lesions in the area of chief complaint Neurologic: Sensation intact distally Psychiatric: Patient is competent for consent with normal mood and affect Lymphatic: No axillary or cervical lymphadenopathy  MUSCULOSKELETAL: pain with ROM and WB. NVI,  Assessment: right femur fracture  Plan: Plan for Procedure(s): INTRAMEDULLARY (IM) NAILRIGHT FEMUR  The risks benefits and alternatives were discussed with the patient including but not limited to the risks of nonoperative treatment, versus surgical intervention including infection, bleeding, nerve injury,  blood clots, cardiopulmonary complications, morbidity, mortality, among others, and they were willing to proceed.   [name],  Horald Pollen, MD Cell [phone]   09/29/2015 12:51 PM

## 2015-09-29 NOTE — Transfer of Care (Signed)
Immediate Anesthesia Transfer of Care Note  Patient: [name]  Procedure(s) Performed: Procedure(s): INTRAMEDULLARY (IM) NAILRIGHT FEMUR (Right)  Patient Location: PACU  Anesthesia Type:General  Level of Consciousness:  sedated, patient cooperative and responds to stimulation  Airway & Oxygen Therapy:Patient Spontanous Breathing and Patient connected to face mask oxgen  Post-op Assessment:  Report given to PACU RN and Post -op Vital signs reviewed and stable  Post vital signs:  Reviewed and stable  Last Vitals:  Filed Vitals:   09/29/15 1005  BP: 135/40  Pulse: 66  Temp: 36.7 C  Resp: 16    Complications: No apparent anesthesia complications

## 2015-09-29 NOTE — Anesthesia Procedure Notes (Signed)
Procedure Name: Intubation Date/Time: 09/29/2015 1:04 PM Performed by: Montel Clock Pre-anesthesia Checklist: Patient identified, Emergency Drugs available, Suction available, Patient being monitored and Timeout performed Patient Re-evaluated:Patient Re-evaluated prior to inductionOxygen Delivery Method: Circle system utilized Preoxygenation: Pre-oxygenation with 100% oxygen Intubation Type: IV induction Ventilation: Mask ventilation without difficulty and Oral airway inserted - appropriate to patient size Laryngoscope Size: 3 and Glidescope Grade View: Grade I Tube type: Oral Tube size: 7.0 mm Number of attempts: 1 Airway Equipment and Method: Stylet Placement Confirmation: ETT inserted through vocal cords under direct vision,  positive ETCO2 and breath sounds checked- equal and bilateral Secured at: 21 cm Tube secured with: Tape Dental Injury: Teeth and Oropharynx as per pre-operative assessment  Comments: Glidescope used related to difficulty with intubation on prior procedure. No difficulty with glidescope 3 blade. Easy mask.

## 2015-09-29 NOTE — Progress Notes (Signed)
Dilaudid 0.5 mg given at 1525 for a total of 1 mg in pacu

## 2015-09-30 ENCOUNTER — Encounter (HOSPITAL_COMMUNITY): Payer: Self-pay | Admitting: Orthopedic Surgery

## 2015-09-30 DIAGNOSIS — S7291XA Unspecified fracture of right femur, initial encounter for closed fracture: Secondary | ICD-10-CM | POA: Diagnosis present

## 2015-09-30 DIAGNOSIS — S72301A Unspecified fracture of shaft of right femur, initial encounter for closed fracture: Secondary | ICD-10-CM | POA: Diagnosis present

## 2015-09-30 DIAGNOSIS — I10 Essential (primary) hypertension: Secondary | ICD-10-CM | POA: Diagnosis present

## 2015-09-30 DIAGNOSIS — Z853 Personal history of malignant neoplasm of breast: Secondary | ICD-10-CM | POA: Diagnosis not present

## 2015-09-30 DIAGNOSIS — Z79899 Other long term (current) drug therapy: Secondary | ICD-10-CM | POA: Diagnosis not present

## 2015-09-30 DIAGNOSIS — W19XXXA Unspecified fall, initial encounter: Secondary | ICD-10-CM | POA: Diagnosis present

## 2015-09-30 DIAGNOSIS — Z881 Allergy status to other antibiotic agents status: Secondary | ICD-10-CM | POA: Diagnosis not present

## 2015-09-30 LAB — BASIC METABOLIC PANEL
Anion gap: 9 (ref 5–15)
BUN: 22 mg/dL — AB (ref 6–20)
CALCIUM: 8.5 mg/dL — AB (ref 8.9–10.3)
CO2: 27 mmol/L (ref 22–32)
CREATININE: 0.75 mg/dL (ref 0.44–1.00)
Chloride: 102 mmol/L (ref 101–111)
GFR calc Af Amer: >60 mL/min (ref >60)
GLUCOSE: 207 mg/dL — AB (ref 65–99)
POTASSIUM: 4.5 mmol/L (ref 3.5–5.1)
SODIUM: 138 mmol/L (ref 135–145)

## 2015-09-30 LAB — CBC
HCT: 32.3 % — ABNORMAL LOW (ref 36.0–46.0)
HEMOGLOBIN: 10.6 g/dL — AB (ref 12.0–15.0)
MCH: 31.1 pg (ref 26.0–34.0)
MCHC: 32.8 g/dL (ref 30.0–36.0)
MCV: 94.7 fL (ref 78.0–100.0)
Platelets: 213 10*3/uL (ref 150–400)
RBC: 3.41 MIL/uL — AB (ref 3.87–5.11)
RDW: 12.7 % (ref 11.5–15.5)
WBC: 12 10*3/uL — ABNORMAL HIGH (ref 4.0–10.5)

## 2015-09-30 MED ORDER — ENOXAPARIN SODIUM 30 MG/0.3ML ~~LOC~~ SOLN: 30.0000 mg | SUBCUTANEOUS | Status: DC

## 2015-09-30 MED ORDER — HYDROCODONE-ACETAMINOPHEN 5-325 MG PO TABS: 1.0000 | ORAL_TABLET | Freq: Four times a day (QID) | ORAL | Status: DC | PRN

## 2015-09-30 NOTE — Evaluation (Signed)
Physical Therapy Evaluation Patient Details Name: [name] MRN: [id number] DOB: Sep 26, 1945 Today's Date: 09/30/2015   History of Present Illness  Pt s/p IM nailing R femur 2* impending fx.  Pt s/p fall and fx L femur 11/16 with IM nailing.  Clinical Impression  Pt admitted as above and presenting with functional mobility limitations 2* decreased R LE strength and post op discomfort.  Pt mobilizing well and plans dc home this date with assist of family and follow up OP PT.    Follow Up Recommendations Outpatient PT    Equipment Recommendations  None recommended by PT    Recommendations for Other Services       Precautions / Restrictions Precautions Precautions: Fall Restrictions Weight Bearing Restrictions: No Other Position/Activity Restrictions: WBAT      Mobility  Bed Mobility Overal bed mobility: Modified Independent             General bed mobility comments: Pt to EOB unassisted  Transfers Overall transfer level: Needs assistance Equipment used: Rolling walker (2 wheeled) Transfers: Sit to/from Stand Sit to Stand: Supervision         General transfer comment: cues for saftey in transition and use of UEs to self assist  Ambulation/Gait Ambulation/Gait assistance: Min guard;Supervision Ambulation Distance (Feet): 222 Feet Assistive device: Rolling walker (2 wheeled) Gait Pattern/deviations: Step-to pattern;Step-through pattern;Decreased step length - right;Decreased step length - left;Shuffle;Trunk flexed     General Gait Details: min cues for posture and position from RW  Stairs Stairs: Yes Stairs assistance: Min assist Stair Management: No rails;One rail Left;Step to pattern;Forwards;With walker;With crutches Number of Stairs: 7 General stair comments: 5 stairs fwd with rail and cane, single step fwd with RW.  Min cues for sequence and foot/cane/RW placement  Wheelchair Mobility    Modified Rankin (Stroke Patients Only)       Balance                                              Pertinent Vitals/Pain Pain Assessment: 0-10 Pain Score: 2  Pain Location: R hip/thigh Pain Descriptors / Indicators: Sore Pain Intervention(s): Limited activity within patient's tolerance;Monitored during session    Home Living Family/patient expects to be discharged to:: Private residence Living Arrangements: Spouse/significant other Available Help at Discharge: Family Type of Home: House Home Access: Stairs to enter Entrance Stairs-Rails: None Entrance Stairs-Number of Steps: 1 Home Layout: Multi-level;Able to live on main level with bedroom/bathroom Home Equipment: [place] - 2 wheels;Shower seat - built in Additional Comments: vanity next to toilet     Prior Function Level of Independence: Independent with assistive device(s)         Comments: using RW since previous L femur fx     Hand Dominance   Dominant Hand: Right    Extremity/Trunk Assessment   Upper Extremity Assessment: Overall WFL for tasks assessed           Lower Extremity Assessment: Defer to PT evaluation RLE Deficits / Details: Pt demonstrating ROM WFL and strength min 3/5 throughout    Cervical / Trunk Assessment: Normal  Communication   Communication: No difficulties  Cognition Arousal/Alertness: Awake/alert Behavior During Therapy: WFL for tasks assessed/performed Overall Cognitive Status: Within Functional Limits for tasks assessed                      General Comments  Exercises General Exercises - Lower Extremity Ankle Circles/Pumps: AROM;Both;15 reps;Supine Quad Sets: AROM;Both;10 reps;Supine Heel Slides: Right;15 reps;Supine;AAROM;AROM Hip ABduction/ADduction: Right;15 reps;AAROM;AROM;Supine      Assessment/Plan    PT Assessment All further PT needs can be met in the next venue of care  PT Diagnosis Difficulty walking   PT Problem List Decreased mobility;Decreased balance;Decreased knowledge of use  of DME;Decreased strength;Pain  PT Treatment Interventions     PT Goals (Current goals can be found in the Care Plan section) Acute Rehab PT Goals Patient Stated Goal: HOME PT Goal Formulation: All assessment and education complete, DC therapy    Frequency     Barriers to discharge        Co-evaluation               End of Session Equipment Utilized During Treatment: Gait belt Activity Tolerance: Patient tolerated treatment well Patient left: in chair;with call bell/phone within reach;with family/visitor present Nurse Communication: Mobility status         Time: 7588-3254 PT Time Calculation (min) (ACUTE ONLY): 40 min   Charges:   PT Evaluation $PT Eval Low Complexity: 1 Procedure PT Treatments $Gait Training: 8-22 mins $Therapeutic Activity: 8-22 mins   PT G Codes:        Dionisios Ricci 2015-10-18, 1:16 PM

## 2015-09-30 NOTE — Progress Notes (Signed)
Occupational Therapy Evaluation Patient Details Name: Soriya Ashlin MRN: [id number] DOB: [birth year] Today's Date: 09/30/2015    History of Present Illness Pt s/p IM nailing R femur 2* impending fx.  Pt s/p fall and fx L femur 11/16 with IM nailing.   Clinical Impression   All education completed regarding ADLs/ADL transfers and pt questions answered. No further OT needs at this time. OT will sign off.    Follow Up Recommendations  No OT follow up;Supervision - Intermittent    Equipment Recommendations  None recommended by OT    Recommendations for Other Services       Precautions / Restrictions Precautions Precautions: Fall Restrictions Weight Bearing Restrictions: No Other Position/Activity Restrictions: WBAT      Mobility Bed Mobility                Transfers Overall transfer level: Needs assistance Equipment used: Rolling walker (2 wheeled) Transfers: Sit to/from Stand Sit to Stand: Supervision             Balance                                            ADL Overall ADL's : Needs assistance/impaired Eating/Feeding: Independent;Sitting   Grooming: Supervision/safety;Sitting;Standing   Upper Body Bathing: Set up;Sitting   Lower Body Bathing: Minimal assistance;Sit to/from stand   Upper Body Dressing : Set up;Sitting   Lower Body Dressing: Minimal assistance;Sit to/from stand   Toilet Transfer: Supervision/safety;Ambulation;Regular Toilet;Grab bars;RW   Toileting- Water quality scientist and Hygiene: Supervision/safety;Sit to/from stand       Functional mobility during ADLs: Supervision/safety;Rolling walker General ADL Comments: Patient's husband present during session. Patient reports she has been through this recently with other hip and is familiar with techniques for ADLs. Reviewed LB dressing techniques; pt educated to get operated leg into pant leg first. Patient to have husband assist as needed and is not interested  in learning about long handled AE. Patient reports she has been toileting without difficulty with nursing staff and her set up at home is adequate. Walk-in shower transfer technique -- verbal education and demonstration provided. Patient declined to practice and reports familiarity with technique from previous surgery/recovery. She has a built-in shower seat in her walk-in shower at home.     Vision     Perception     Praxis      Pertinent Vitals/Pain Pain Assessment: 0-10 Pain Score: 2  Pain Location: R hip/thigh Pain Descriptors / Indicators: Sore Pain Intervention(s): Limited activity within patient's tolerance;Monitored during session     Hand Dominance Right   Extremity/Trunk Assessment Upper Extremity Assessment Upper Extremity Assessment: Overall WFL for tasks assessed   Lower Extremity Assessment Lower Extremity Assessment: Defer to PT evaluation    Cervical / Trunk Assessment Cervical / Trunk Assessment: Normal   Communication Communication Communication: No difficulties   Cognition Arousal/Alertness: Awake/alert Behavior During Therapy: WFL for tasks assessed/performed Overall Cognitive Status: Within Functional Limits for tasks assessed                     General Comments       Exercises      Shoulder Instructions      Home Living Family/patient expects to be discharged to:: Private residence Living Arrangements: Spouse/significant other Available Help at Discharge: Family Type of Home: House Home Access: Stairs to enter [employer] of Steps:  1 Entrance Stairs-Rails: None Home Layout: Multi-level;Able to live on main level with bedroom/bathroom Alternate Level Stairs-Number of Steps: 14 Alternate Level Stairs-Rails: Left Bathroom Shower/Tub: Occupational psychologist: Standard Bathroom Accessibility: Yes How Accessible: Accessible via walker Home Equipment: [place] - 2 wheels;Shower seat - built in   Additional  Comments: vanity next to toilet       Prior Functioning/Environment Level of Independence: Independent with assistive device(s)        Comments: using RW since previous L femur fx    OT Diagnosis: Acute pain   OT Problem List: Decreased strength;Decreased range of motion;Impaired balance (sitting and/or standing);Pain   OT Treatment/Interventions:      OT Goals(Current goals can be found in the care plan section) Acute Rehab OT Goals Patient Stated Goal: HOME OT Goal Formulation: All assessment and education complete, DC therapy  OT Frequency:     Barriers to D/C:            Co-evaluation              End of Session Equipment Utilized During Treatment: Rolling walker Nurse Communication: Mobility status  Activity Tolerance: Patient tolerated treatment well Patient left: in chair;with call bell/phone within reach;with chair alarm set;with family/visitor present   Time: [id number] OT Time Calculation (min): 10 min Charges:  OT General Charges $OT Visit: 1 Procedure OT Evaluation $OT Eval Low Complexity: 1 Procedure G-Codes:    Nuala Chiles A 10/29/15, 1:18 PM

## 2015-09-30 NOTE — Discharge Summary (Signed)
Physician Discharge Summary  Patient ID: [name] MRN: [id number] DOB/AGE: 01-16-1946 70 y.o.  Admit date: 09/29/2015 Discharge date: 10/01/2015  Admission Diagnoses:  Closed fracture of right femur, initial encounter  Discharge Diagnoses:  Principal Problem:   Closed fracture of right femur, initial encounter   Past Medical History  Diagnosis Date  . Hypertension   . Cancer [facility])     left- radiation '02 and surgery(lumpectomy and axilla node dissection)  . Dysrhythmia     frequent PVC's and irregular heart beat-" Pulse oximeter not good indicator of heart rate" -evaluated by Dr Malon Kindle - [place] LOV 09-02-15 with chart.logy-  . Heart murmur   . Transfusion history     '72 - s/p childbirth  . Depression     mild during cancer tx- Zoloft controls    Surgeries: Procedure(s): INTRAMEDULLARY (IM) NAILRIGHT FEMUR on 09/29/2015   Consultants (if any):    Discharged Condition: Improved  Hospital Course: Crystale Pfohl is an 70 y.o. female who was admitted 09/29/2015 with a diagnosis of Closed fracture of right femur, initial encounter and went to the operating room on 09/29/2015 and underwent the above named procedures.    She was given perioperative antibiotics:      Anti-infectives    Start     Dose/Rate Route Frequency Ordered Stop   09/29/15 1800  ceFAZolin (ANCEF) IVPB 2 g/50 mL premix     2 g 100 mL/hr over 30 Minutes Intravenous Every 6 hours 09/29/15 1558 09/30/15 0042   09/29/15 1030  ceFAZolin (ANCEF) IVPB 2 g/50 mL premix     2 g 100 mL/hr over 30 Minutes Intravenous On call to O.R. 09/29/15 1015 09/29/15 1305   09/29/15 1013  clindamycin (CLEOCIN) IVPB 900 mg     900 mg 100 mL/hr over 30 Minutes Intravenous On call to O.R. 09/29/15 1013 09/29/15 1336    .  She was given sequential compression devices, early ambulation, and lovenox for DVT prophylaxis.  She benefited maximally from the hospital stay and there were no complications.    Recent  vital signs:  Filed Vitals:   09/30/15 0616 09/30/15 0942  BP: 121/50 108/50  Pulse: 79 91  Temp: 98.6 F (37 C) 98.5 F (36.9 C)  Resp: 16 16    Recent laboratory studies:  Lab Results  Component Value Date   HGB 10.6* 09/30/2015   HGB 12.9 09/29/2015   HGB 12.9 09/15/2015   Lab Results  Component Value Date   WBC 12.0* 09/30/2015   PLT 213 09/30/2015   Lab Results  Component Value Date   INR 1.08 09/29/2015   Lab Results  Component Value Date   NA 138 09/30/2015   K 4.5 09/30/2015   CL 102 09/30/2015   CO2 27 09/30/2015   BUN 22* 09/30/2015   CREATININE 0.75 09/30/2015   GLUCOSE 207* 09/30/2015    Discharge Medications:     Medication List    TAKE these medications        CALCIUM 600 + D PO  Take 1 tablet by mouth 2 (two) times daily.     enoxaparin 30 MG/0.3ML injection  Commonly known as:  LOVENOX  Inject 0.3 mLs (30 mg total) into the skin daily.     exemestane 25 MG tablet  Commonly known as:  AROMASIN  Take 25 mg by mouth every morning.     HYDROcodone-acetaminophen 5-325 MG tablet  Commonly known as:  NORCO/VICODIN  Take 1-2 tablets by mouth every 6 (six)  hours as needed for moderate pain.     lisinopril 20 MG tablet  Commonly known as:  PRINIVIL,ZESTRIL  Take 20 mg by mouth daily after lunch.     metoprolol tartrate 25 MG tablet  Commonly known as:  LOPRESSOR  Take 25 mg by mouth 2 (two) times daily.     PROBIOTIC PO  Take 1 capsule by mouth every morning.     sertraline 25 MG tablet  Commonly known as:  ZOLOFT  Take 25 mg by mouth daily.        Diagnostic Studies: Pelvis Portable  09/29/2015  CLINICAL DATA:  Postop right femoral nail. EXAM: PORTABLE PELVIS 1-2 VIEWS COMPARISON:  09/29/2015 FINDINGS: Newly placed right femur intra medullary nail with femoral neck screw. There is a small cortical fracture line at the site of femoral neck screw without visible continuation across the femur. Two-view femur radiography is pending  which may be helpful. Prior fixation of a upper left femoral diaphysis fracture with mature periosteal reaction consistent with healing. IMPRESSION: Incomplete appearing cortical fracture at the site of right femoral neck screw insertion. There is pending two-view femur radiography. Electronically Signed   By: Monte Fantasia M.D.   On: 09/29/2015 15:40   Dg C-arm 1-60 Min-no Report  09/29/2015  CLINICAL DATA: Surgery C-ARM 1-60 MINUTES Fluoroscopy was utilized by the requesting physician.  No radiographic interpretation.   Dg Femur, Min 2 Views Right  09/29/2015  CLINICAL DATA:  70 year old female -placement of intra medullary nail within the right femur. EXAM: RIGHT FEMUR 2 VIEWS; DG C-ARM 1-60 MIN-NO REPORT COMPARISON:  None. FINDINGS: Four intraoperative spot views of the right femur are submitted postoperatively for interpretation. An intra medullary nail/screw within the right femur is identified. No definite complicating features are identified. IMPRESSION: Intra medullary nail/screw within the right femur. No definite complicating features. Electronically Signed   By: Margarette Canada M.D.   On: 09/29/2015 14:39   Dg Femur Port, Min 2 Views Right  09/29/2015  CLINICAL DATA:  Postop check. EXAM: RIGHT FEMUR PORTABLE 1 VIEW COMPARISON:  07/06/2015 FINDINGS: There is an intramedullary nail extending through the femur with a dynamic hip screw. There is obliquely oriented fracture involving the proximal femur at the level of the lesser trochanter. Again noted is focal lateral cortical lump in the proximal femoral diaphysis. This area has new lucency but this could be related to soft tissue gas. There is a distal interlocking screw. The right hip is located. Again noted is a sclerotic lesion involving the right ischium. IMPRESSION: Placement of right femoral intramedullary nail. There is a small fracture involving the proximal femur in the intertrochanteric area. In addition, questionable lucency in the  lateral cortex of the proximal femoral diaphysis. Electronically Signed   By: Markus Daft M.D.   On: 09/29/2015 15:45    Disposition: 01-Home or Self Care  Discharge Instructions    Call MD / Call 911    Complete by:  As directed   If you experience chest pain or shortness of breath, CALL 911 and be transported to the hospital emergency room.  If you develope a fever above 101 F, pus (white drainage) or increased drainage or redness at the wound, or calf pain, call your surgeon's office.     Constipation Prevention    Complete by:  As directed   Drink plenty of fluids.  Prune juice may be helpful.  You may use a stool softener, such as Colace (over the counter) 100 mg twice  a day.  Use MiraLax (over the counter) for constipation as needed.     Diet - low sodium heart healthy    Complete by:  As directed      Driving restrictions    Complete by:  As directed   No driving for 6 weeks     Increase activity slowly as tolerated    Complete by:  As directed      Lifting restrictions    Complete by:  As directed   No lifting for 6 weeks           Follow-up Information    Follow up with Tysheka Fanguy, Horald Pollen, MD. Schedule an appointment as soon as possible for a visit in 2 weeks.   Specialty:  Orthopedic Surgery   Why:  For wound re-check   Contact information:   [address] [address] [phone]        Signed: Elie Goody 10/01/2015, 5:46 AM

## 2015-09-30 NOTE — Clinical Social Work Note (Signed)
CSW consult received.  Pt will be discharging home with outpatient PT.  Signing off as there are no CSW needs at this time.  Please submit a new consult should further needs arise.  [place], [place]

## 2015-09-30 NOTE — Care Management Note (Signed)
Case Management Note  Patient Details  Name: [name] MRN: [id number] Date of Birth: 1945/08/08  Subjective/Objective:      Closed fracture of right femur              Action/Plan: Discharge planning, no HH needs identified, Patient to continue with OP PT as previously arranged.  Expected Discharge Date:                  Expected Discharge Plan:  Home/Self Care  In-House Referral:  NA  Discharge planning Services  CM Consult  Post Acute Care Choice:  Resumption of Svcs/PTA Provider Choice offered to:  NA  DME Arranged:  N/A DME Agency:  NA  HH Arranged:  NA HH Agency:  NA  Status of Service:  Completed, signed off  Medicare Important Message Given:    Date Medicare IM Given:    Medicare IM give by:    Date Additional Medicare IM Given:    Additional Medicare Important Message give by:     If discussed at [place] of Stay Meetings, dates discussed:    Additional Comments:  Guadalupe Maple, RN 09/30/2015, 11:34 AM [phone]

## 2015-09-30 NOTE — Progress Notes (Signed)
   Subjective:  Patient reports pain as mild to moderate.  No c/o.   Objective:   VITALS:   Filed Vitals:   09/29/15 1954 09/29/15 2231 09/30/15 0239 09/30/15 0616  BP: 119/45 108/49 104/43 121/50  Pulse: 112 110 93 79  Temp: 97.3 F (36.3 C) 98.4 F (36.9 C) 98.8 F (37.1 C) 98.6 F (37 C)  TempSrc: Oral Oral Oral Oral  Resp:  16 16 16   Height:      Weight:      SpO2:  98% 98% 96%    ABD soft Sensation intact distally Intact pulses distally Dorsiflexion/Plantar flexion intact Incision: dressing C/D/I Compartment soft   Lab Results  Component Value Date   WBC 12.0* 09/30/2015   HGB 10.6* 09/30/2015   HCT 32.3* 09/30/2015   MCV 94.7 09/30/2015   PLT 213 09/30/2015   BMET    Component Value Date/Time   NA 138 09/30/2015 0413   K 4.5 09/30/2015 0413   CL 102 09/30/2015 0413   CO2 27 09/30/2015 0413   GLUCOSE 207* 09/30/2015 0413   BUN 22* 09/30/2015 0413   CREATININE 0.75 09/30/2015 0413   CALCIUM 8.5* 09/30/2015 0413   GFRNONAA >60 09/30/2015 0413   GFRAA >60 09/30/2015 0413     Assessment/Plan: 1 Day Post-Op   Active Problems:   Closed fracture of right femur, initial encounter   WBAT with walker lovenox PO pain control PT D/C home today, will resume outpatient PT   Farin Buhman, Horald Pollen 09/30/2015, 7:12 AM   Rod Can, MD Cell [phone]

## 2015-09-30 NOTE — Progress Notes (Signed)
Nurse discussed discharge information with patient and husband. Patient and husband voice understanding and have no further questions at this time.

## 2015-09-30 NOTE — Discharge Instructions (Signed)
 Dr. Kaylia Winborne Adult Hip & Knee Specialist [facility] [address] [place],  [address] [phone]   POSTOPERATIVE DIRECTIONS    Hip Rehabilitation, Guidelines Following Surgery   WEIGHT BEARING Weight bearing as tolerated with assist device (walker, cane, etc) as directed, use it as long as suggested by your surgeon or therapist, typically at least 4-6 weeks.   HOME CARE INSTRUCTIONS  Remove items at home which could result in a fall. This includes throw rugs or furniture in walking pathways.  Continue medications as instructed at time of discharge.  You may have some home medications which will be placed on hold until you complete the course of blood thinner medication.  4 days after discharge, you may start showering. No tub baths or soaking your incisions. Do not put on socks or shoes without following the instructions of your caregivers.   Sit on chairs with arms. Use the chair arms to help push yourself up when arising.  Arrange for the use of a toilet seat elevator so you are not sitting low.   Walk with walker as instructed.  You may resume a sexual relationship in one month or when given the OK by your caregiver.  Use walker as long as suggested by your caregivers.  Avoid periods of inactivity such as sitting longer than an hour when not asleep. This helps prevent blood clots.  You may return to work once you are cleared by your surgeon.  Do not drive a car for 6 weeks or until released by your surgeon.  Do not drive while taking narcotics.  Wear elastic stockings for two weeks following surgery during the day but you may remove then at night.  Make sure you keep all of your appointments after your operation with all of your doctors and caregivers. You should call the office at the above phone number and make an appointment for approximately two weeks after the date of your surgery. Please pick up a stool softener and laxative  for home use as long as you are requiring pain medications.  ICE to the affected hip every three hours for 30 minutes at a time and then as needed for pain and swelling. Continue to use ice on the hip for pain and swelling from surgery. You may notice swelling that will progress down to the foot and ankle.  This is normal after surgery.  Elevate the leg when you are not up walking on it.   It is important for you to complete the blood thinner medication as prescribed by your doctor.  Continue to use the breathing machine which will help keep your temperature down.  It is common for your temperature to cycle up and down following surgery, especially at night when you are not up moving around and exerting yourself.  The breathing machine keeps your lungs expanded and your temperature down.  RANGE OF MOTION AND STRENGTHENING EXERCISES  These exercises are designed to help you keep full movement of your hip joint. Follow your caregiver's or physical therapist's instructions. Perform all exercises about fifteen times, three times per day or as directed. Exercise both hips, even if you have had only one joint replacement. These exercises can be done on a training (exercise) mat, on the floor, on a table or on a bed. Use whatever works the best and is most comfortable for you. Use music or television while you are exercising so that the exercises are a pleasant break in your day. This   will make your life better with the exercises acting as a break in routine you can look forward to.  Lying on your back, slowly slide your foot toward your buttocks, raising your knee up off the floor. Then slowly slide your foot back down until your leg is straight again.  Lying on your back spread your legs as far apart as you can without causing discomfort.  Lying on your side, raise your upper leg and foot straight up from the floor as far as is comfortable. Slowly lower the leg and repeat.  Lying on your back, tighten up the  muscle in the front of your thigh (quadriceps muscles). You can do this by keeping your leg straight and trying to raise your heel off the floor. This helps strengthen the largest muscle supporting your knee.  Lying on your back, tighten up the muscles of your buttocks both with the legs straight and with the knee bent at a comfortable angle while keeping your heel on the floor.   SKILLED REHAB INSTRUCTIONS: If the patient is transferred to a skilled rehab facility following release from the hospital, a list of the current medications will be sent to the facility for the patient to continue.  When discharged from the skilled rehab facility, please have the facility set up the patient's Home Health Physical Therapy prior to being released. Also, the skilled facility will be responsible for providing the patient with their medications at time of release from the facility to include their pain medication and their blood thinner medication. If the patient is still at the rehab facility at time of the two week follow up appointment, the skilled rehab facility will also need to assist the patient in arranging follow up appointment in our office and any transportation needs.  MAKE SURE YOU:  Understand these instructions.  Will watch your condition.  Will get help right away if you are not doing well or get worse.  Pick up stool softner and laxative for home use following surgery while on pain medications. Daily dry dressing changes as needed. In 4 days, you may remove your dressings and begin taking showers - no tub baths or soaking the incisions. Continue to use ice for pain and swelling after surgery. Do not use any lotions or creams on the incision until instructed by your surgeon.   

## 2015-10-01 NOTE — Op Note (Signed)
[name], [name]                   ACCOUNT NO.:  [id number]  MEDICAL RECORD NO.:  [id number]  LOCATION:  [id number]                         FACILITY:  [facility]  PHYSICIAN:  Rod Can, MD     DATE OF BIRTH:  [birth year]  DATE OF PROCEDURE:  09/29/2015 DATE OF DISCHARGE:  09/30/2015                              OPERATIVE REPORT   PREOPERATIVE DIAGNOSIS:  Impending atypical right femoral shaft fracture.  POSTOPERATIVE DIAGNOSIS:  Impending atypical right femoral shaft fracture.  PROCEDURE PERFORMED:  Cephalomedullary fixation of right femur.  IMPLANTS: 1. Synthes TFNA 9 x 320-mm nail, 130 degrees. 2. TFNA screw, 90 mm. 3. A 5-mm distal interlocking screw x1.  ANESTHESIA:  General.  EBL:  50 mL.  SPECIMENS:  Femoral reamings for permanent pathology.  ANTIBIOTICS:  2 g of Ancef.  COMPLICATIONS:  Minimally displaced pertrochanteric femur fracture.  DISPOSITION:  Stable to PACU.  INDICATIONS:  The patient is a very pleasant 70 year old female with a known history of metastatic breast cancer.  She was treated with Delton See medication that was stopped in October of 2016.  She was found to have right femur pain.  X-rays by her oncologist revealed atypical impending fracture.  Despite stopping the medication several months ago, she has had persistent femoral pain.  Risks, benefits, and alternatives to surgical fixation were explained, and the patient elected to proceed.  DESCRIPTION OF PROCEDURE IN DETAIL:  The patient was identified in the holding area using 2 identifiers.  The surgical site was marked by myself.  She was taken to the operating room and general anesthesia was induced on her bed.  She was then transitioned to the [place] table.  The left lower extremity was scissored underneath the right.  I applied gentle traction, abduction, and internal rotation.  The right hip was prepped and draped in the normal sterile surgical fashion.  Time-out was called verifying site and site  of surgery.  She then received IV antibiotics within 60 minutes to began the procedure.  I began by making a 4 cm incision proximal to the tip of the trochanter. I used the guide pin to establish the standard starting point for the trochanteric entry nail.  The entry reamer was used.  The reamings were sent to pathology.  I then placed a guide wire to the physeal scar of the knee.  I measured the length.  I reamed up to 10.5 mm with excellent chatter.  I then placed the real 9 x 320 nail.  The nail was easily impacted in the place.  Through a separate stab incision, I inserted the guidewire for cephalomedullary device.  The pin was initially placed too posteriorly in the femoral head.  As I attempted to antevert the jig by I felt a small pop in the proximal femur.  I brought an x-rays and she did develop a small pertrochanteric fracture at the lateral aspect of the nail.  I was able to get the guide pin in a center center position. I then measured drill placed to real cephalomedullary screw.  I tightened the set screw and then I removed the jig.  Tip apex distance was appropriate.  I then  noted a small minimally displaced fracture to the lateral aspect to the proximal femur that the nail would more than adequately treat.  I then turned my attention distally.  Using perfect circle technique, I placed one distal interlocking screw.  Final AP and lateral fluoroscopy views were obtained.  The wounds were copiously irrigated with saline.  The wounds were closed in layers using #1 Vicryl for the fascia, 2-0 Monocryl for the deep dermal layer, running 3-0 Monocryl subcuticular stitch.  Glue was applied.  Once the glue was dried, Mepilex dressings were applied.  The patient was then transferred to her bed, extubated, taken to PACU in stable condition.  Sponge, needle, and instruments counts were correct at the end of the case x2.  I discussed the operative events and findings with the  patient's husband.  She may weight bear as tolerated with a walker.  We will admit her overnight.  She will work with Physical Therapy.  I will place her on Lovenox for DVT prophylaxis.  I will see her back in the office in 2 weeks.  All questions solicited and answered.          ______________________________ Rod Can, MD     BS/MEDQ  D:  10/01/2015  T:  10/01/2015  Job:  [id number]

## 2016-05-31 ENCOUNTER — Telehealth: Payer: Self-pay | Admitting: Internal Medicine

## 2016-05-31 NOTE — Telephone Encounter (Signed)
Records placed on Dr. Gessner's desk for review.  

## 2016-06-05 ENCOUNTER — Encounter: Payer: Self-pay | Admitting: Internal Medicine

## 2016-08-09 ENCOUNTER — Encounter: Payer: Self-pay | Admitting: Gastroenterology

## 2016-08-14 ENCOUNTER — Ambulatory Visit (INDEPENDENT_AMBULATORY_CARE_PROVIDER_SITE_OTHER): Payer: Medicare Other | Admitting: Internal Medicine

## 2016-08-14 ENCOUNTER — Encounter (INDEPENDENT_AMBULATORY_CARE_PROVIDER_SITE_OTHER): Payer: Self-pay

## 2016-08-14 ENCOUNTER — Encounter: Payer: Self-pay | Admitting: Internal Medicine

## 2016-08-14 VITALS — BP 128/62 | HR 64 | Ht 60.0 in | Wt 121.6 lb

## 2016-08-14 DIAGNOSIS — Z86010 Personal history of colonic polyps: Secondary | ICD-10-CM | POA: Diagnosis not present

## 2016-08-14 DIAGNOSIS — K5730 Diverticulosis of large intestine without perforation or abscess without bleeding: Secondary | ICD-10-CM | POA: Diagnosis not present

## 2016-08-14 DIAGNOSIS — R1013 Epigastric pain: Secondary | ICD-10-CM

## 2016-08-14 NOTE — Patient Instructions (Signed)
   Your colon is due November 2018 and we will contact you when that time gets closer.    We are giving you handouts to read today on diverticulosis and high fiber diet.     I appreciate the opportunity to care for you. Silvano Rusk, MD, [facility]

## 2016-08-14 NOTE — Progress Notes (Signed)
[name] 71 y.o. November 05, 1945 [id number]  Assessment & Plan:   Encounter Diagnoses  Name Primary?  . History of adenomatous polyp of colon Yes  . Dyspepsia   . Diverticulosis of colon without hemorrhage      Stay on ranitidine for dyspepsia Probable colonoscopy  06/2017 surveillance - she does have metastatic breast cancer but life expectancy can be long w/ Tx so will likely do another exam unless rapid progression in interim  Hi fiber diet for occasional constipation and lower abd pain thought from diverticulosis Diverticulosis handout given  I appreciate the opportunity to care for this patient. Cc: Dr. Darlina Sicilian  Subjective:   Chief Complaint: hx colon polyps  HPI  Very nice woman here w/ husband - she is former Dr. West Carbo patient - hx colon polyps small cecal adenoma 06/2012. Recently saw Dr. Vista Deck - PCP -he started 300 mg ranitidine prn dyspepsia and that works. Occasional hard stool, mild lower abd pain assoc. no dysphgaia, no vomiting, rectal bleeding GI ROS o/w negative. 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 Slowly progressive if at all.  Asking about her diverticulosis also - we discussed.  Allergies  Allergen Reactions  . Keflex [Cephalexin] Diarrhea   Outpatient Medications Prior to Visit  Medication Sig Dispense Refill  . Calcium Carb-Cholecalciferol (CALCIUM 600 + D PO) Take 1 tablet by mouth 2 (two) times daily.    Marland Kitchen lisinopril (PRINIVIL,ZESTRIL) 20 MG tablet Take 20 mg by mouth daily after lunch.     . metoprolol tartrate (LOPRESSOR) 25 MG tablet Take 25 mg by mouth 2 (two) times daily.    . Probiotic Product (PROBIOTIC PO) Take 1 capsule by mouth every morning.     . sertraline (ZOLOFT) 25 MG tablet Take 25 mg by mouth daily.    Marland Kitchen enoxaparin (LOVENOX) 30 MG/0.3ML injection Inject 0.3 mLs (30 mg total) into the skin daily. 30 Syringe 0  . exemestane (AROMASIN) 25 MG tablet Take 25 mg by mouth every  morning.     Marland Kitchen HYDROcodone-acetaminophen (NORCO/VICODIN) 5-325 MG tablet Take 1-2 tablets by mouth every 6 (six) hours as needed for moderate pain. 80 tablet 0   No facility-administered medications prior to visit.    Past Medical History:  Diagnosis Date  . Adenomatous colon polyp   . Breast cancer [facility])    left- radiation '02 and surgery(lumpectomy and axilla node dissection)  . Depression    mild during cancer tx- Zoloft controls  . Diverticulosis   . Dysrhythmia    frequent PVC's and irregular heart beat-" Pulse oximeter not good indicator of heart rate" -evaluated by Dr Malon Kindle - [place] LOV 09-02-15 with chart.logy-  . Heart murmur   . Hypertension   . Transfusion history    '72 - s/p childbirth   Past Surgical History:  Procedure Laterality Date  . BREAST SURGERY    . CATARACT EXTRACTION, BILATERAL Bilateral   . FEMUR IM NAIL Left 07/06/2015   Procedure: INTRAMEDULLARY (IM) NAIL FEMORAL;  Surgeon: Rod Can, MD;  Location: [facility];  Service: Orthopedics;  Laterality: Left;  . FEMUR IM NAIL Right 09/29/2015   Procedure: INTRAMEDULLARY (IM) NAILRIGHT FEMUR;  Surgeon: Rod Can, MD;  Location: [facility];  Service: Orthopedics;  Laterality: Right;  . TUBAL LIGATION     Social History   Social History  . Marital status: Married    Spouse name: N/A  . Number of children: 3  . Years  of education: N/A   Occupational History  . Retired    Social History Main Topics  . Smoking status: Never Smoker  . Smokeless tobacco: Never Used  . Alcohol use Yes     Comment: occassional wine  . Drug use: No  . Sexual activity: Not Asked   Other Topics Concern  . None   Social History Narrative  . None   Family History  Problem Relation Age of Onset  . Throat cancer Father    Review of Systems Some back pain and night sweats All other ROS negative  Objective:   Physical Exam  @BP  128/62   Pulse 64   Ht 5' (1.524 m)   Wt 121 lb 9.6 oz (55.2 kg)   BMI  23.75 kg/m @  General:  Well-developed, well-nourished and in no acute distress Eyes:  anicteric. ENT:   Mouth and posterior pharynx free of lesions.  Neck:   supple w/o thyromegaly or mass.  Lungs: Clear to auscultation bilaterally. Heart:  S1S2, no rubs, murmurs, gallops. Abdomen:  soft, non-tender, no hepatosplenomegaly, hernia, or mass and BS+.  Lymph:  no cervical or supraclavicular adenopathy. Extremities:   no edema, cyanosis or clubbing Skin   no rash. Neuro:  A&O x 3.  Psych:  appropriate mood and  Affect.   Data Reviewed: PCP note, prior colonoscopy 2013 2017 [facility] oncology notes

## 2016-08-15 ENCOUNTER — Encounter: Payer: Self-pay | Admitting: Internal Medicine

## 2016-09-05 ENCOUNTER — Other Ambulatory Visit: Payer: Self-pay | Admitting: Orthopedic Surgery

## 2016-09-05 DIAGNOSIS — M545 Low back pain: Secondary | ICD-10-CM

## 2016-09-13 ENCOUNTER — Other Ambulatory Visit: Payer: Medicare Other

## 2016-11-04 DEATH — deceased

## 2016-11-14 IMAGING — CT CT FEMUR *L* W/ CM
1 series · 12 of 14 positions shown, 15 images · IV contrast (omnipaque)
Comparison: None.

CLINICAL DATA: History of breast cancer with bone metastases.
Status post fall on a slippery floor 07/05/2015 with a left femur
fracture.

EXAM:
CT OF THE LEFT FEMUR WITH CONTRAST
TECHNIQUE: Contiguous axial CT images were taken through left femur after the
administration of IV contrast material coronal and sagittal
reconstructions are provided.
CONTRAST:  100 mL OMNIPAQUE IOHEXOL 300 MG/ML  SOLN

[Series 4: hip st · axial · 0.61mm/px · z∈[-596,-292]mm · 12 of 73 slices shown, 15 images]
[im 6/73  soft-tissue]
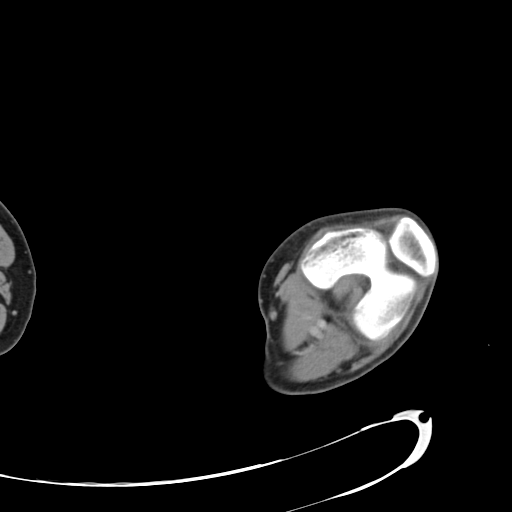
[im 6/73  bone]
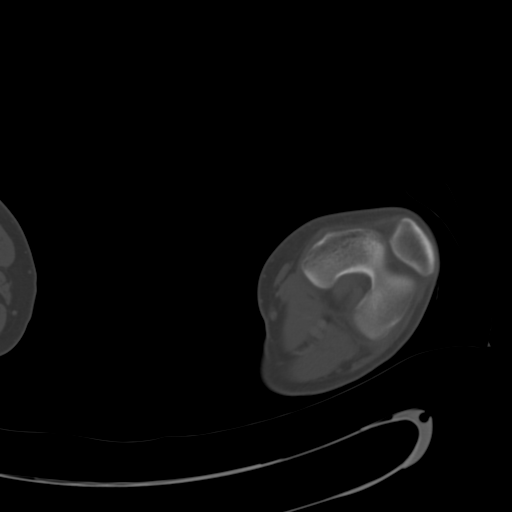
[im 12/73  bone]
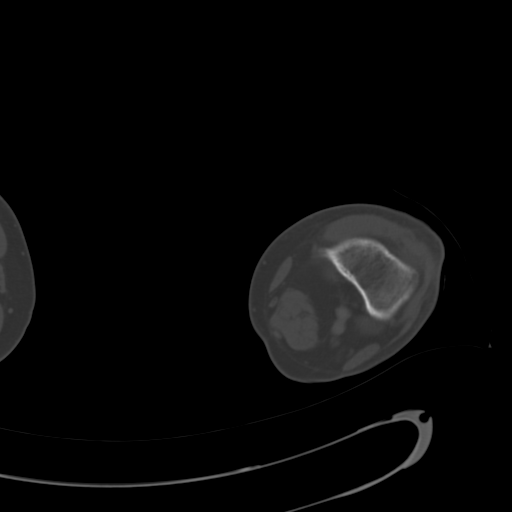
[im 17/73  bone]
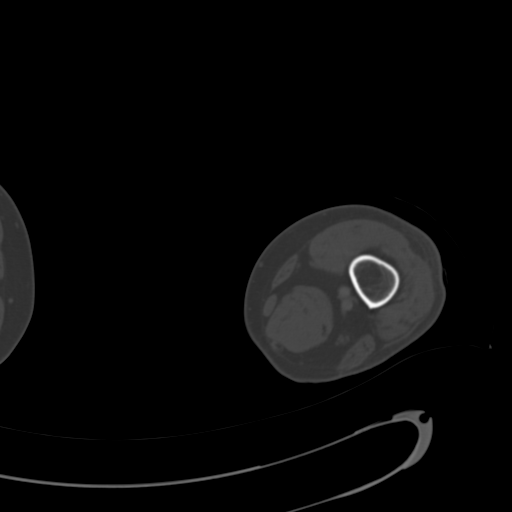
[im 23/73  bone]
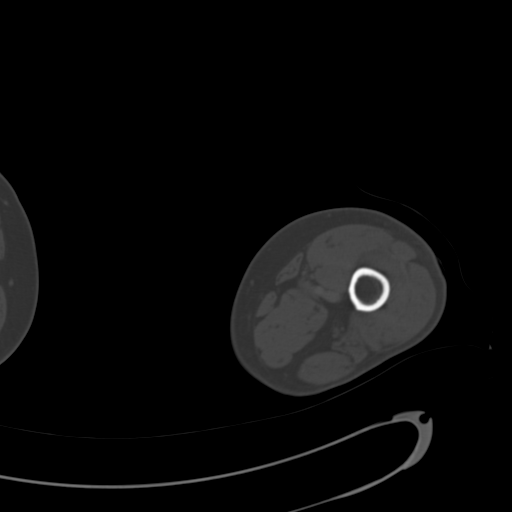
[im 28/73  soft-tissue]
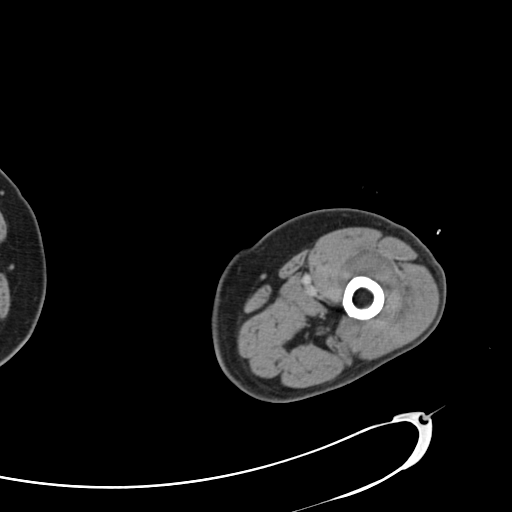
[im 28/73  bone]
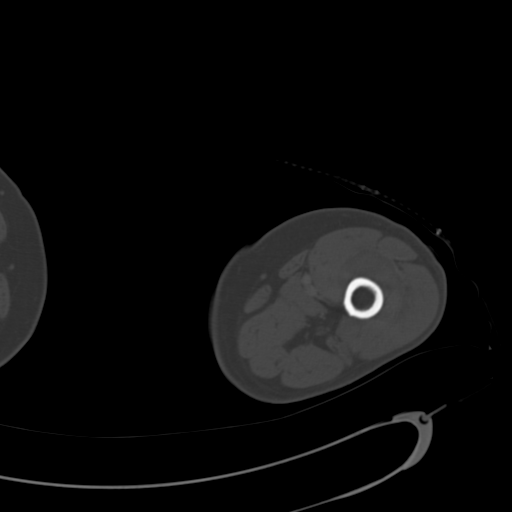
[im 34/73  bone]
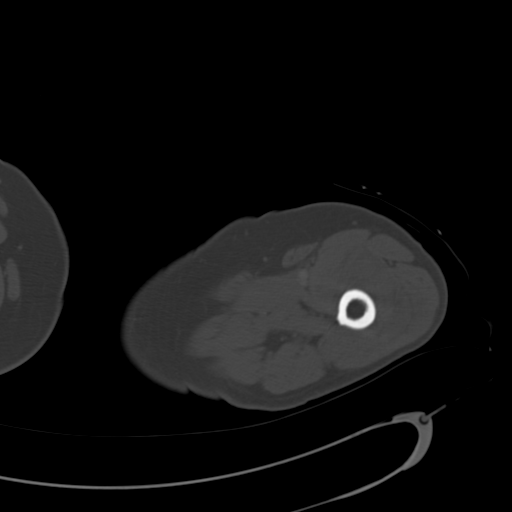
[im 39/73  bone]
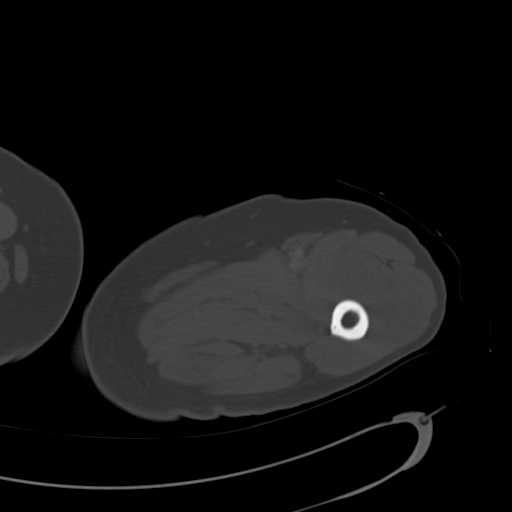
[im 45/73  bone]
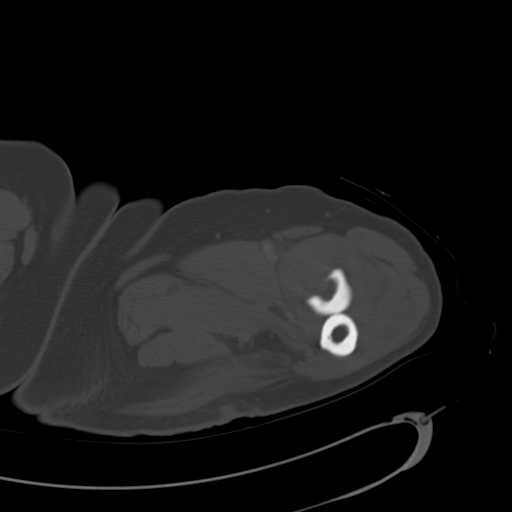
[im 50/73  soft-tissue]
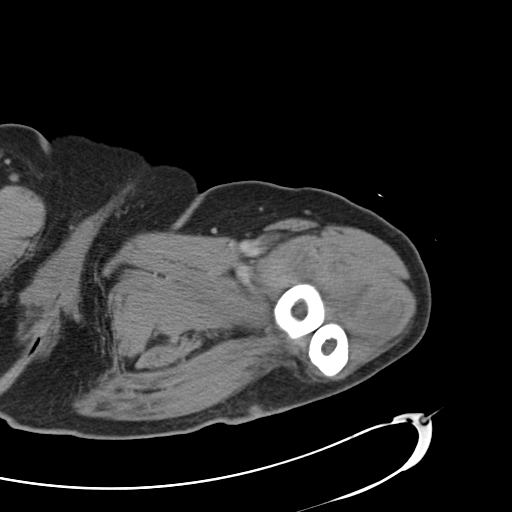
[im 50/73  bone]
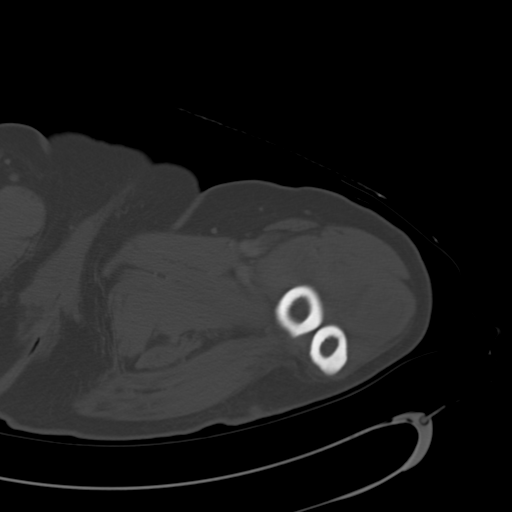
[im 56/73  bone]
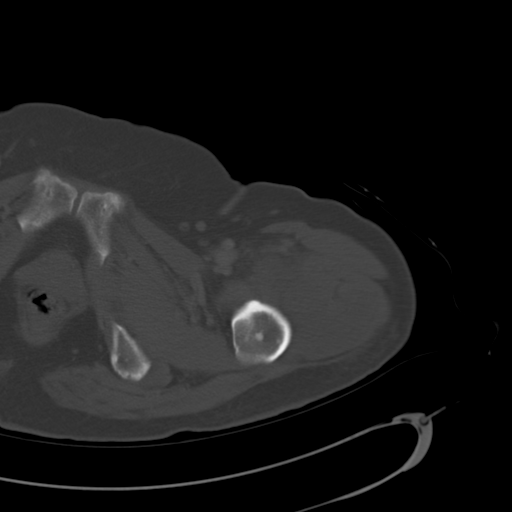
[im 61/73  bone]
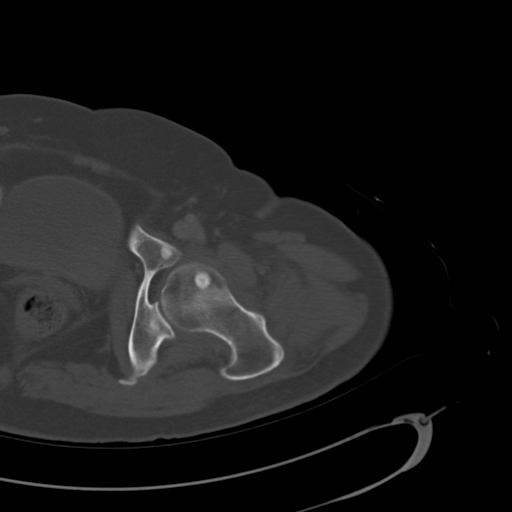
[im 67/73  bone]
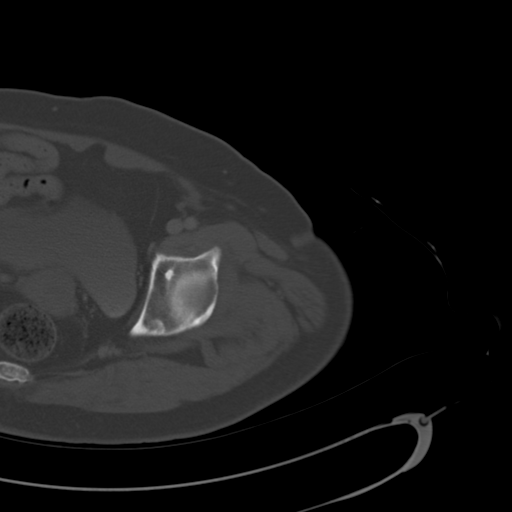

[12 of 14 positions shown; findings below may reference images not displayed]

FINDINGS: As seen on the comparison plain films, there is a transverse
fracture through the junction of the middle and distal thirds of the
left femur. The distal fracture fragment demonstrates 1 shaft width
posterior and lateral displacement. There is foreshortening of
approximately 6.5 cm. No sclerotic or lytic bone lesion is seen
about the fracture fragments. Multiple small sclerotic lesions are
identified in the imaged pelvis and right hip consistent with
metastatic disease.

Soft tissues demonstrate hematoma in association with the patient's
fracture. No soft tissue mass is identified. Imaged intrapelvic
contents show some sigmoid diverticulosis without diverticulitis.
IMPRESSION: Transverse diaphyseal fracture through the junction of the middle
and distal thirds of the left femur as described above without
findings to suggest pathologic injury.

Multiple small sclerotic lesions are seen in the imaged pelvis and
femoral head and neck consistent with metastatic breast carcinoma.

Sigmoid diverticulosis.

## 2016-11-14 IMAGING — RF DG FEMUR 2+V*L*
1 series · 4 of 4 positions shown · non-contrast
Comparison: None.

CLINICAL DATA: Left femur fracture.  Adamantinoma.

EXAM:
LEFT FEMUR 2 VIEWS; DG C-ARM 1-60 MIN-NO REPORT

[Series 1: run · 4 of 4 slices shown]
[im 1/4]
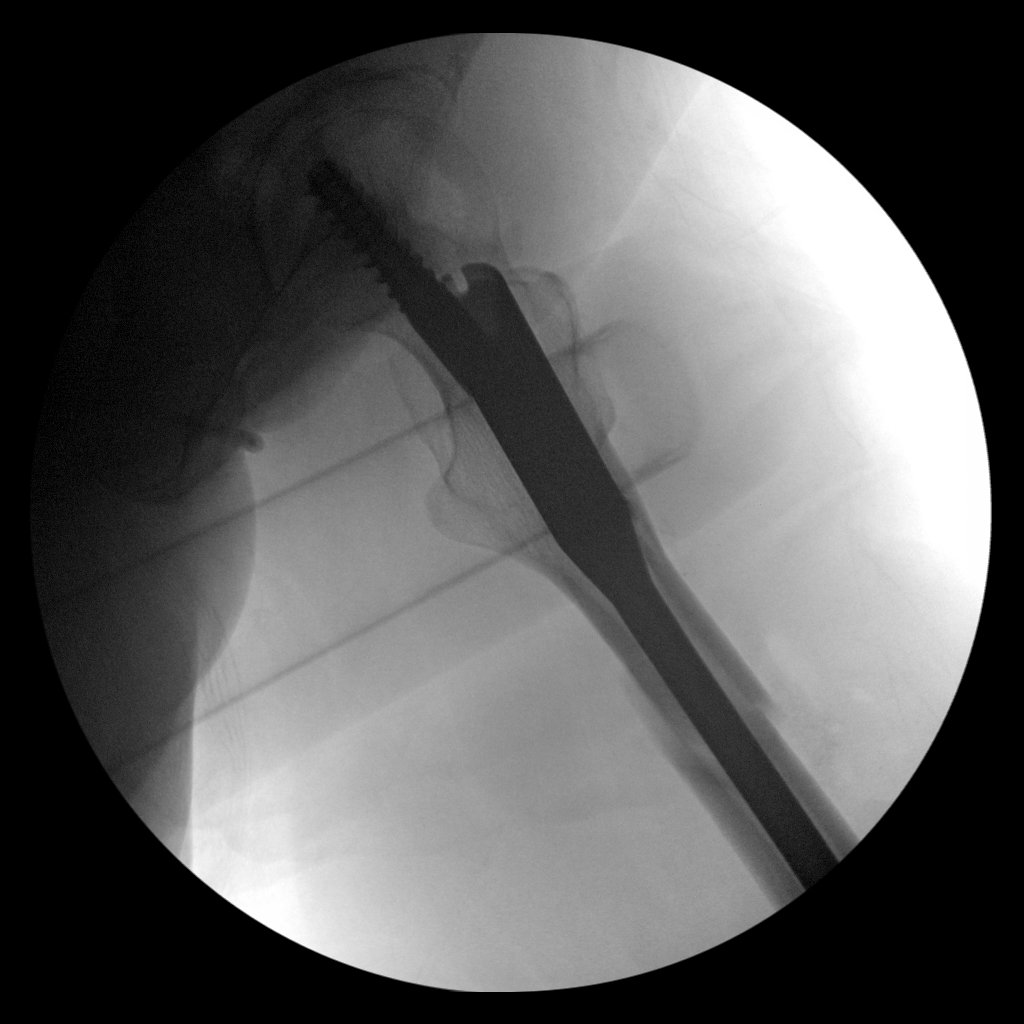
[im 2/4]
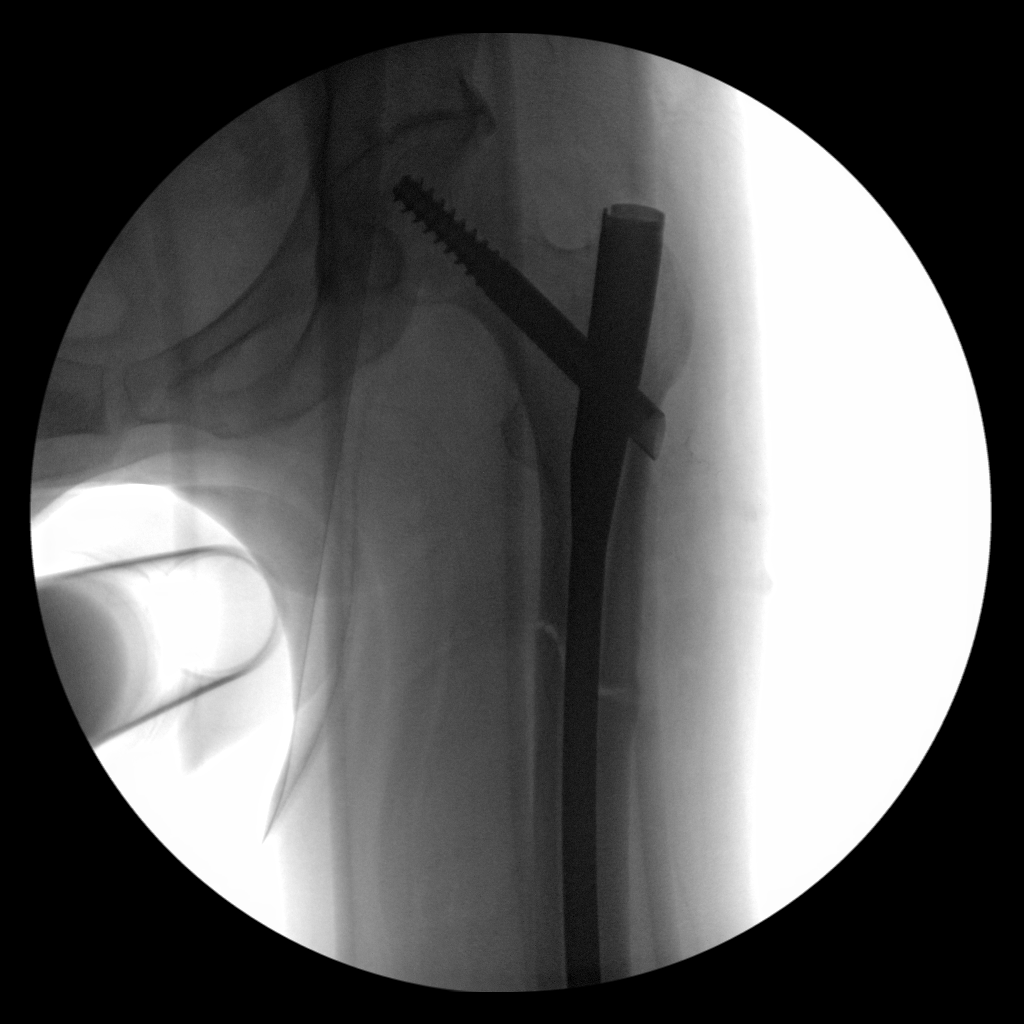
[im 3/4]
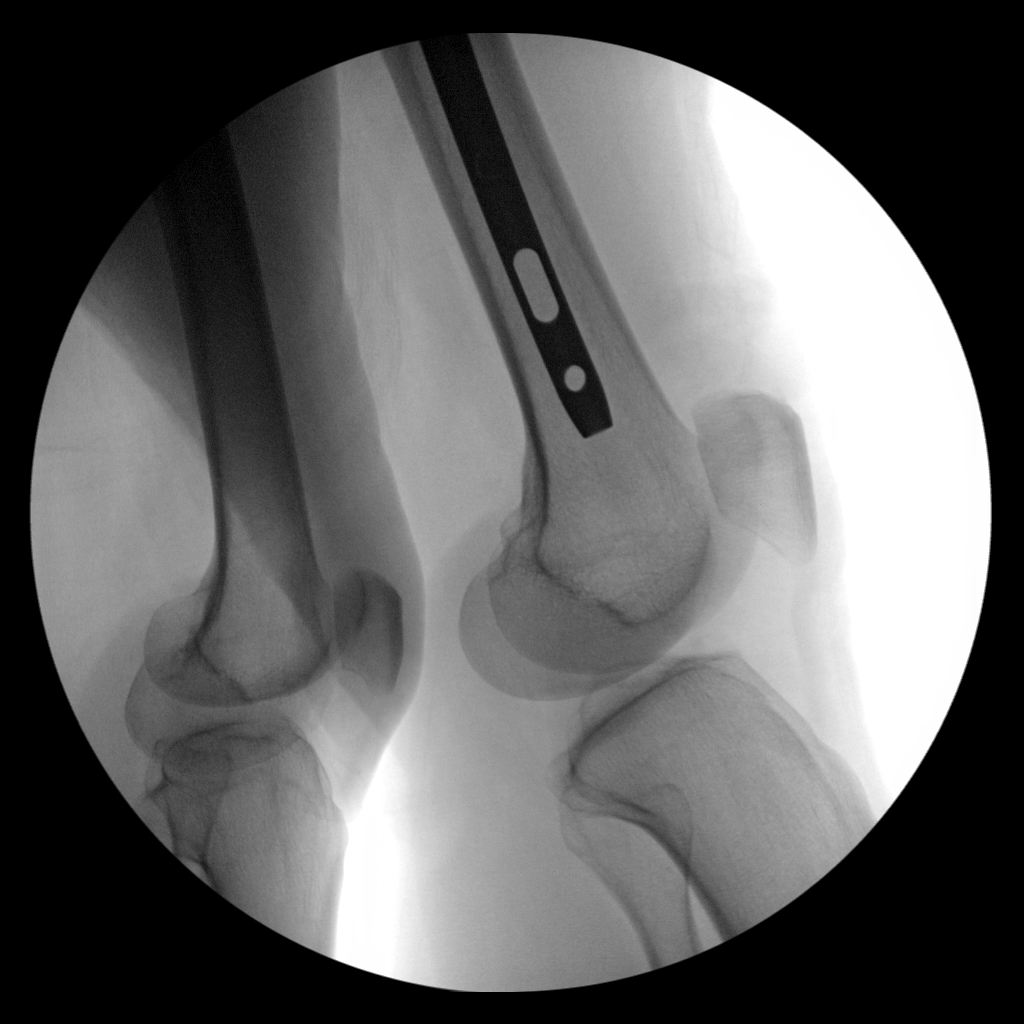
[im 4/4]
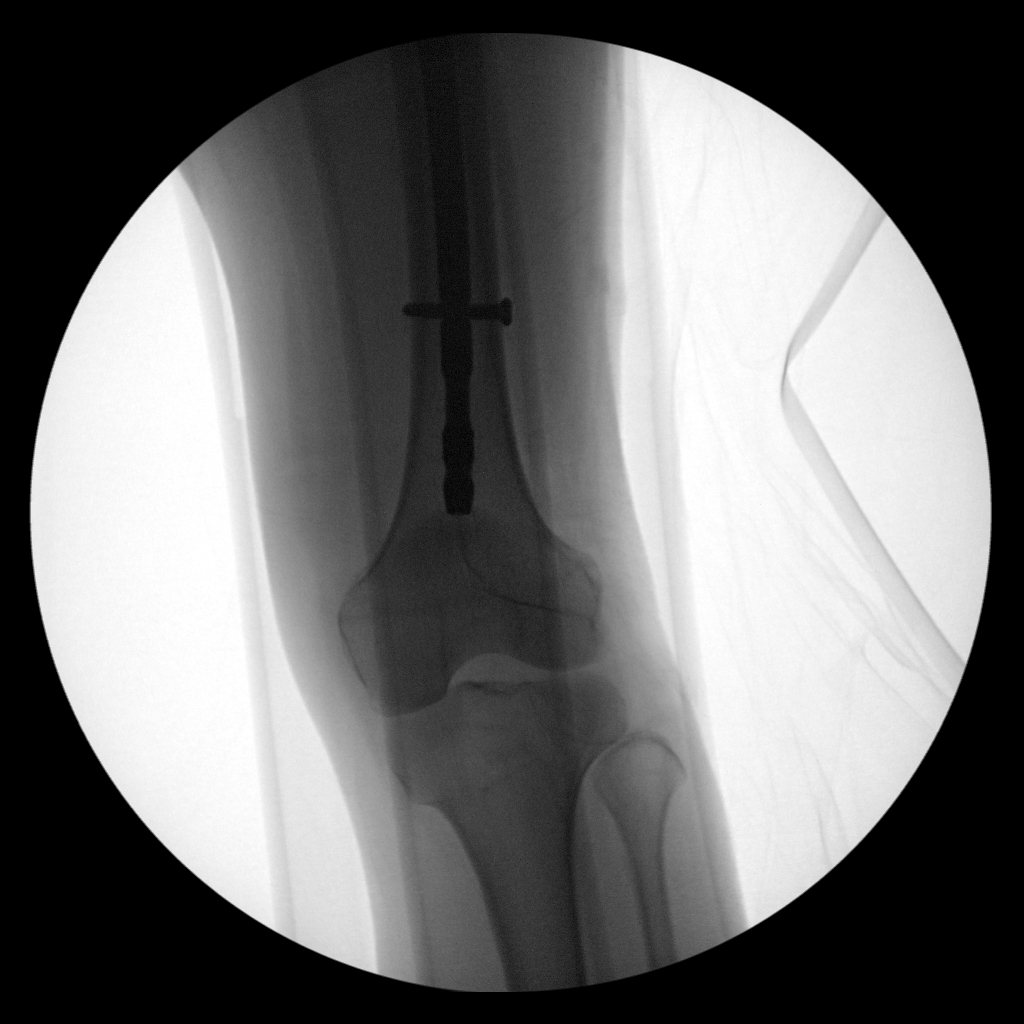

[4 of 4 positions shown; findings below may reference images not displayed]

FINDINGS: The patient is status post ORIF with placement of an intra medullary
rod. The fracture is aligned on the AP view with less than 1 width
of the cortex displacement remaining on the lateral image. A dynamic
hip screws in place. A single distal interlocking screw is noted.
The knee and hip are located.
IMPRESSION: Status post ORIF of the proximal left femur fracture without
radiographic evidence for complication.

## 2017-08-02 ENCOUNTER — Encounter: Payer: Self-pay | Admitting: Internal Medicine
